# Patient Record
Sex: Female | Born: 1940 | Hispanic: No | Smoking: Never smoker
Health system: Southern US, Community
[De-identification: ages and names within clinical notes are randomized; demographics above are authoritative.]

## PROBLEM LIST (undated history)

## (undated) DIAGNOSIS — H5789 Other specified disorders of eye and adnexa: Secondary | ICD-10-CM

## (undated) DIAGNOSIS — E785 Hyperlipidemia, unspecified: Secondary | ICD-10-CM

## (undated) DIAGNOSIS — R87612 Low grade squamous intraepithelial lesion on cytologic smear of cervix (LGSIL): Secondary | ICD-10-CM

## (undated) DIAGNOSIS — K047 Periapical abscess without sinus: Secondary | ICD-10-CM

## (undated) DIAGNOSIS — R053 Chronic cough: Secondary | ICD-10-CM

## (undated) DIAGNOSIS — W19XXXA Unspecified fall, initial encounter: Secondary | ICD-10-CM

## (undated) DIAGNOSIS — R05 Cough: Secondary | ICD-10-CM

## (undated) DIAGNOSIS — R87619 Unspecified abnormal cytological findings in specimens from cervix uteri: Secondary | ICD-10-CM

## (undated) DIAGNOSIS — K5900 Constipation, unspecified: Secondary | ICD-10-CM

## (undated) DIAGNOSIS — M542 Cervicalgia: Secondary | ICD-10-CM

## (undated) DIAGNOSIS — IMO0002 Reserved for concepts with insufficient information to code with codable children: Secondary | ICD-10-CM

## (undated) DIAGNOSIS — Z227 Latent tuberculosis: Secondary | ICD-10-CM

## (undated) DIAGNOSIS — I10 Essential (primary) hypertension: Secondary | ICD-10-CM

## (undated) HISTORY — DX: Chronic cough: R05.3

## (undated) HISTORY — DX: Unspecified abnormal cytological findings in specimens from cervix uteri: R87.619

## (undated) HISTORY — DX: Other specified disorders of eye and adnexa: H57.89

## (undated) HISTORY — DX: Latent tuberculosis: Z22.7

## (undated) HISTORY — DX: Reserved for concepts with insufficient information to code with codable children: IMO0002

## (undated) HISTORY — DX: Essential (primary) hypertension: I10

## (undated) HISTORY — DX: Cough: R05

## (undated) HISTORY — PX: CATARACT EXTRACTION: SUR2

## (undated) HISTORY — PX: CHOLECYSTECTOMY: SHX55

## (undated) HISTORY — DX: Low grade squamous intraepithelial lesion on cytologic smear of cervix (LGSIL): R87.612

## (undated) HISTORY — DX: Hyperlipidemia, unspecified: E78.5

---
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
## 2003-01-19 ENCOUNTER — Encounter: Payer: Self-pay | Admitting: Surgery

## 2003-01-19 ENCOUNTER — Inpatient Hospital Stay (HOSPITAL_COMMUNITY): Admission: EM | Admit: 2003-01-19 | Discharge: 2003-01-23 | Payer: Self-pay | Admitting: Emergency Medicine

## 2003-01-19 ENCOUNTER — Encounter: Payer: Self-pay | Admitting: Emergency Medicine

## 2003-01-21 ENCOUNTER — Encounter: Payer: Self-pay | Admitting: Internal Medicine

## 2003-01-22 ENCOUNTER — Encounter: Payer: Self-pay | Admitting: Surgery

## 2003-01-22 ENCOUNTER — Encounter (INDEPENDENT_AMBULATORY_CARE_PROVIDER_SITE_OTHER): Payer: Self-pay | Admitting: Specialist

## 2003-03-15 ENCOUNTER — Encounter: Admission: RE | Admit: 2003-03-15 | Discharge: 2003-03-15 | Payer: Self-pay | Admitting: Internal Medicine

## 2003-03-19 ENCOUNTER — Encounter: Admission: RE | Admit: 2003-03-19 | Discharge: 2003-03-19 | Payer: Self-pay | Admitting: Internal Medicine

## 2003-03-19 ENCOUNTER — Encounter: Payer: Self-pay | Admitting: Internal Medicine

## 2003-03-19 ENCOUNTER — Ambulatory Visit (HOSPITAL_COMMUNITY): Admission: RE | Admit: 2003-03-19 | Discharge: 2003-03-19 | Payer: Self-pay | Admitting: Internal Medicine

## 2003-04-05 ENCOUNTER — Encounter: Admission: RE | Admit: 2003-04-05 | Discharge: 2003-04-05 | Payer: Self-pay | Admitting: Internal Medicine

## 2003-04-19 ENCOUNTER — Encounter: Admission: RE | Admit: 2003-04-19 | Discharge: 2003-04-19 | Payer: Self-pay | Admitting: Internal Medicine

## 2004-07-13 ENCOUNTER — Ambulatory Visit (HOSPITAL_COMMUNITY): Admission: RE | Admit: 2004-07-13 | Discharge: 2004-07-13 | Payer: Self-pay | Admitting: Internal Medicine

## 2004-07-13 ENCOUNTER — Ambulatory Visit: Payer: Self-pay | Admitting: Internal Medicine

## 2004-07-14 ENCOUNTER — Ambulatory Visit: Payer: Self-pay | Admitting: Internal Medicine

## 2004-07-22 ENCOUNTER — Ambulatory Visit: Payer: Self-pay | Admitting: Internal Medicine

## 2004-07-29 ENCOUNTER — Ambulatory Visit: Payer: Self-pay | Admitting: Internal Medicine

## 2004-08-12 ENCOUNTER — Ambulatory Visit: Payer: Self-pay | Admitting: Internal Medicine

## 2004-09-18 ENCOUNTER — Ambulatory Visit: Payer: Self-pay | Admitting: Internal Medicine

## 2004-09-23 ENCOUNTER — Ambulatory Visit: Payer: Self-pay | Admitting: Internal Medicine

## 2004-12-08 ENCOUNTER — Encounter (INDEPENDENT_AMBULATORY_CARE_PROVIDER_SITE_OTHER): Payer: Self-pay | Admitting: Internal Medicine

## 2004-12-10 ENCOUNTER — Ambulatory Visit (HOSPITAL_COMMUNITY): Admission: RE | Admit: 2004-12-10 | Discharge: 2004-12-10 | Payer: Self-pay | Admitting: *Deleted

## 2005-01-14 ENCOUNTER — Ambulatory Visit (HOSPITAL_COMMUNITY): Admission: RE | Admit: 2005-01-14 | Discharge: 2005-01-14 | Payer: Self-pay | Admitting: Internal Medicine

## 2005-01-14 ENCOUNTER — Ambulatory Visit: Payer: Self-pay | Admitting: Internal Medicine

## 2005-01-19 ENCOUNTER — Ambulatory Visit: Payer: Self-pay | Admitting: Internal Medicine

## 2005-03-18 ENCOUNTER — Ambulatory Visit: Payer: Self-pay | Admitting: Internal Medicine

## 2005-03-23 ENCOUNTER — Ambulatory Visit (HOSPITAL_COMMUNITY): Admission: RE | Admit: 2005-03-23 | Discharge: 2005-03-23 | Payer: Self-pay | Admitting: Internal Medicine

## 2005-04-23 ENCOUNTER — Ambulatory Visit: Payer: Self-pay | Admitting: Internal Medicine

## 2005-04-30 ENCOUNTER — Ambulatory Visit: Payer: Self-pay | Admitting: Internal Medicine

## 2005-09-23 ENCOUNTER — Ambulatory Visit: Payer: Self-pay | Admitting: Internal Medicine

## 2005-09-29 ENCOUNTER — Ambulatory Visit: Payer: Self-pay | Admitting: Internal Medicine

## 2005-11-15 ENCOUNTER — Encounter (INDEPENDENT_AMBULATORY_CARE_PROVIDER_SITE_OTHER): Payer: Self-pay | Admitting: Specialist

## 2005-11-15 ENCOUNTER — Encounter (INDEPENDENT_AMBULATORY_CARE_PROVIDER_SITE_OTHER): Payer: Self-pay | Admitting: Internal Medicine

## 2005-11-15 ENCOUNTER — Ambulatory Visit: Payer: Self-pay | Admitting: Internal Medicine

## 2005-11-15 DIAGNOSIS — R87612 Low grade squamous intraepithelial lesion on cytologic smear of cervix (LGSIL): Secondary | ICD-10-CM | POA: Insufficient documentation

## 2005-11-22 ENCOUNTER — Ambulatory Visit: Payer: Self-pay | Admitting: Internal Medicine

## 2006-02-02 ENCOUNTER — Other Ambulatory Visit: Admission: RE | Admit: 2006-02-02 | Discharge: 2006-02-02 | Payer: Self-pay | Admitting: Gynecology

## 2006-02-02 ENCOUNTER — Ambulatory Visit: Payer: Self-pay | Admitting: Gynecology

## 2006-02-02 ENCOUNTER — Encounter (INDEPENDENT_AMBULATORY_CARE_PROVIDER_SITE_OTHER): Payer: Self-pay | Admitting: Specialist

## 2006-03-03 ENCOUNTER — Ambulatory Visit: Payer: Self-pay | Admitting: Hospitalist

## 2006-04-20 DIAGNOSIS — E119 Type 2 diabetes mellitus without complications: Secondary | ICD-10-CM | POA: Insufficient documentation

## 2006-04-20 DIAGNOSIS — I10 Essential (primary) hypertension: Secondary | ICD-10-CM | POA: Insufficient documentation

## 2006-05-24 DIAGNOSIS — E785 Hyperlipidemia, unspecified: Secondary | ICD-10-CM | POA: Insufficient documentation

## 2006-09-19 ENCOUNTER — Ambulatory Visit: Payer: Self-pay | Admitting: *Deleted

## 2006-09-19 DIAGNOSIS — K089 Disorder of teeth and supporting structures, unspecified: Secondary | ICD-10-CM | POA: Insufficient documentation

## 2006-09-19 LAB — CONVERTED CEMR LAB
Blood Glucose, Fingerstick: 190
Hgb A1c MFr Bld: 6.4 %

## 2006-10-04 ENCOUNTER — Ambulatory Visit (HOSPITAL_COMMUNITY): Admission: RE | Admit: 2006-10-04 | Discharge: 2006-10-04 | Payer: Self-pay | Admitting: Internal Medicine

## 2007-02-14 ENCOUNTER — Ambulatory Visit: Payer: Self-pay | Admitting: Infectious Disease

## 2007-02-14 LAB — CONVERTED CEMR LAB: Blood Glucose, Fingerstick: 159

## 2007-02-17 ENCOUNTER — Encounter (INDEPENDENT_AMBULATORY_CARE_PROVIDER_SITE_OTHER): Payer: Self-pay | Admitting: *Deleted

## 2007-02-17 ENCOUNTER — Ambulatory Visit: Payer: Self-pay | Admitting: Internal Medicine

## 2007-02-18 LAB — CONVERTED CEMR LAB
ALT: 15 units/L (ref 0–35)
AST: 17 units/L (ref 0–37)
Albumin: 4.2 g/dL (ref 3.5–5.2)
Alkaline Phosphatase: 71 units/L (ref 39–117)
BUN: 20 mg/dL (ref 6–23)
Calcium: 9.2 mg/dL (ref 8.4–10.5)
Chloride: 106 meq/L (ref 96–112)
Cholesterol: 208 mg/dL — ABNORMAL HIGH (ref 0–200)
Creatinine, Ser: 0.79 mg/dL (ref 0.40–1.20)
Glucose, Bld: 114 mg/dL — ABNORMAL HIGH (ref 70–99)
HDL: 46 mg/dL (ref >39)
LDL Cholesterol: 151 mg/dL — ABNORMAL HIGH (ref 0–99)
Potassium: 4.5 meq/L (ref 3.5–5.3)
Triglycerides: 57 mg/dL (ref <150)

## 2007-08-01 ENCOUNTER — Ambulatory Visit: Payer: Self-pay | Admitting: Internal Medicine

## 2007-08-01 ENCOUNTER — Encounter (INDEPENDENT_AMBULATORY_CARE_PROVIDER_SITE_OTHER): Payer: Self-pay | Admitting: Internal Medicine

## 2007-08-01 LAB — CONVERTED CEMR LAB: Hgb A1c MFr Bld: 8.7 %

## 2007-08-02 ENCOUNTER — Encounter (INDEPENDENT_AMBULATORY_CARE_PROVIDER_SITE_OTHER): Payer: Self-pay | Admitting: Internal Medicine

## 2007-08-02 ENCOUNTER — Ambulatory Visit: Payer: Self-pay | Admitting: Infectious Disease

## 2007-08-09 ENCOUNTER — Ambulatory Visit: Payer: Self-pay | Admitting: Hospitalist

## 2007-08-09 LAB — CONVERTED CEMR LAB: Blood Glucose, Home Monitor: 1 mg/dL

## 2007-08-15 DIAGNOSIS — D696 Thrombocytopenia, unspecified: Secondary | ICD-10-CM

## 2007-08-15 LAB — CONVERTED CEMR LAB
ALT: 12 units/L (ref 0–35)
AST: 14 units/L (ref 0–37)
Calcium: 9.7 mg/dL (ref 8.4–10.5)
Cholesterol: 204 mg/dL — ABNORMAL HIGH (ref 0–200)
HCT: 42.2 % (ref 36.0–46.0)
HDL: 40 mg/dL (ref >39)
LDL Cholesterol: 152 mg/dL — ABNORMAL HIGH (ref 0–99)
MCHC: 35.3 g/dL (ref 30.0–36.0)
Microalb Creat Ratio: 5.8 mg/g (ref 0.0–30.0)
Platelets: 131 10*3/uL — ABNORMAL LOW (ref 150–400)
Potassium: 4.8 meq/L (ref 3.5–5.3)
RBC: 4.62 M/uL (ref 3.87–5.11)
Total Bilirubin: 1.3 mg/dL — ABNORMAL HIGH (ref 0.3–1.2)
Total CHOL/HDL Ratio: 5.1
Total Protein: 7.5 g/dL (ref 6.0–8.3)

## 2007-08-16 ENCOUNTER — Encounter (INDEPENDENT_AMBULATORY_CARE_PROVIDER_SITE_OTHER): Payer: Self-pay | Admitting: *Deleted

## 2007-08-16 ENCOUNTER — Ambulatory Visit: Payer: Self-pay | Admitting: Internal Medicine

## 2007-08-16 DIAGNOSIS — D72819 Decreased white blood cell count, unspecified: Secondary | ICD-10-CM | POA: Insufficient documentation

## 2007-08-16 LAB — CONVERTED CEMR LAB
Blood Glucose, Fingerstick: 152
Calcium: 9.8 mg/dL (ref 8.4–10.5)
Chloride: 106 meq/L (ref 96–112)
Creatinine, Ser: 0.74 mg/dL (ref 0.40–1.20)
Potassium: 3.9 meq/L (ref 3.5–5.3)
Sodium: 142 meq/L (ref 135–145)

## 2007-09-14 ENCOUNTER — Ambulatory Visit: Payer: Self-pay | Admitting: *Deleted

## 2007-09-14 LAB — CONVERTED CEMR LAB: Blood Glucose, Fingerstick: 195

## 2007-09-18 ENCOUNTER — Encounter (INDEPENDENT_AMBULATORY_CARE_PROVIDER_SITE_OTHER): Payer: Self-pay | Admitting: Internal Medicine

## 2007-09-18 ENCOUNTER — Ambulatory Visit: Payer: Self-pay | Admitting: Hospitalist

## 2007-09-18 LAB — CONVERTED CEMR LAB
BUN: 14 mg/dL (ref 6–23)
CO2: 23 meq/L (ref 19–32)
Creatinine, Ser: 0.83 mg/dL (ref 0.40–1.20)
Glucose, Bld: 212 mg/dL — ABNORMAL HIGH (ref 70–99)
Potassium: 4.2 meq/L (ref 3.5–5.3)

## 2007-10-17 ENCOUNTER — Ambulatory Visit (HOSPITAL_COMMUNITY): Admission: RE | Admit: 2007-10-17 | Discharge: 2007-10-17 | Payer: Self-pay | Admitting: *Deleted

## 2007-10-27 ENCOUNTER — Encounter: Admission: RE | Admit: 2007-10-27 | Discharge: 2007-10-27 | Payer: Self-pay | Admitting: *Deleted

## 2007-10-30 ENCOUNTER — Ambulatory Visit: Payer: Self-pay | Admitting: Hospitalist

## 2007-10-30 ENCOUNTER — Encounter (INDEPENDENT_AMBULATORY_CARE_PROVIDER_SITE_OTHER): Payer: Self-pay | Admitting: Internal Medicine

## 2007-11-01 ENCOUNTER — Encounter (INDEPENDENT_AMBULATORY_CARE_PROVIDER_SITE_OTHER): Payer: Self-pay | Admitting: *Deleted

## 2007-11-01 ENCOUNTER — Ambulatory Visit (HOSPITAL_COMMUNITY): Admission: RE | Admit: 2007-11-01 | Discharge: 2007-11-01 | Payer: Self-pay | Admitting: Internal Medicine

## 2007-11-06 LAB — CONVERTED CEMR LAB
AST: 17 units/L (ref 0–37)
Alkaline Phosphatase: 69 units/L (ref 39–117)
CO2: 23 meq/L (ref 19–32)
Calcium: 9.4 mg/dL (ref 8.4–10.5)
Glucose, Bld: 84 mg/dL (ref 70–99)
HDL: 39 mg/dL — ABNORMAL LOW (ref >39)
LDL Cholesterol: 110 mg/dL — ABNORMAL HIGH (ref 0–99)
Total Protein: 7.4 g/dL (ref 6.0–8.3)
VLDL: 11 mg/dL (ref 0–40)

## 2007-11-07 ENCOUNTER — Telehealth: Payer: Self-pay | Admitting: *Deleted

## 2007-11-13 DIAGNOSIS — M810 Age-related osteoporosis without current pathological fracture: Secondary | ICD-10-CM

## 2007-12-08 ENCOUNTER — Ambulatory Visit: Payer: Self-pay | Admitting: Internal Medicine

## 2007-12-08 LAB — CONVERTED CEMR LAB: Blood Glucose, Fingerstick: 197

## 2008-02-29 ENCOUNTER — Encounter (INDEPENDENT_AMBULATORY_CARE_PROVIDER_SITE_OTHER): Payer: Self-pay | Admitting: Internal Medicine

## 2008-02-29 ENCOUNTER — Ambulatory Visit: Payer: Self-pay | Admitting: *Deleted

## 2008-02-29 LAB — CONVERTED CEMR LAB: Hgb A1c MFr Bld: 6.3 %

## 2008-03-04 LAB — CONVERTED CEMR LAB
AST: 15 units/L (ref 0–37)
Albumin: 4.3 g/dL (ref 3.5–5.2)
Alkaline Phosphatase: 48 units/L (ref 39–117)
CO2: 25 meq/L (ref 19–32)
Calcium: 9.2 mg/dL (ref 8.4–10.5)
Cholesterol: 187 mg/dL (ref 0–200)
Creatinine, Ser: 0.9 mg/dL (ref 0.40–1.20)
Glucose, Bld: 103 mg/dL — ABNORMAL HIGH (ref 70–99)
HDL: 45 mg/dL (ref >39)
Total CHOL/HDL Ratio: 4.2
Total Protein: 7.5 g/dL (ref 6.0–8.3)

## 2008-03-19 ENCOUNTER — Encounter (INDEPENDENT_AMBULATORY_CARE_PROVIDER_SITE_OTHER): Payer: Self-pay | Admitting: *Deleted

## 2008-03-29 ENCOUNTER — Ambulatory Visit: Payer: Self-pay | Admitting: Internal Medicine

## 2008-04-11 LAB — HM DIABETES EYE EXAM

## 2008-04-16 ENCOUNTER — Ambulatory Visit: Payer: Self-pay | Admitting: Internal Medicine

## 2008-04-16 ENCOUNTER — Observation Stay (HOSPITAL_COMMUNITY): Admission: EM | Admit: 2008-04-16 | Discharge: 2008-04-18 | Payer: Self-pay | Admitting: Emergency Medicine

## 2008-04-16 ENCOUNTER — Ambulatory Visit: Payer: Self-pay | Admitting: *Deleted

## 2008-04-17 ENCOUNTER — Encounter (INDEPENDENT_AMBULATORY_CARE_PROVIDER_SITE_OTHER): Payer: Self-pay | Admitting: Internal Medicine

## 2008-04-30 ENCOUNTER — Encounter (INDEPENDENT_AMBULATORY_CARE_PROVIDER_SITE_OTHER): Payer: Self-pay | Admitting: Internal Medicine

## 2008-05-01 ENCOUNTER — Encounter (INDEPENDENT_AMBULATORY_CARE_PROVIDER_SITE_OTHER): Payer: Self-pay | Admitting: Internal Medicine

## 2008-05-01 ENCOUNTER — Ambulatory Visit: Payer: Self-pay | Admitting: Internal Medicine

## 2008-05-01 LAB — CONVERTED CEMR LAB
Calcium: 9.1 mg/dL (ref 8.4–10.5)
Leukocytes, UA: NEGATIVE
Nitrite: NEGATIVE
Protein, ur: NEGATIVE mg/dL
Specific Gravity, Urine: 1.019 (ref 1.005–1.03)

## 2008-06-07 ENCOUNTER — Ambulatory Visit: Payer: Self-pay | Admitting: Internal Medicine

## 2008-06-07 DIAGNOSIS — R928 Other abnormal and inconclusive findings on diagnostic imaging of breast: Secondary | ICD-10-CM | POA: Insufficient documentation

## 2008-06-07 LAB — CONVERTED CEMR LAB: Blood Glucose, Fingerstick: 193

## 2008-06-18 ENCOUNTER — Encounter: Admission: RE | Admit: 2008-06-18 | Discharge: 2008-06-18 | Payer: Self-pay | Admitting: *Deleted

## 2008-06-21 ENCOUNTER — Encounter (INDEPENDENT_AMBULATORY_CARE_PROVIDER_SITE_OTHER): Payer: Self-pay | Admitting: *Deleted

## 2008-06-21 ENCOUNTER — Ambulatory Visit: Payer: Self-pay | Admitting: Internal Medicine

## 2008-06-21 LAB — CONVERTED CEMR LAB
BUN: 18 mg/dL (ref 6–23)
CO2: 24 meq/L (ref 19–32)
Chloride: 105 meq/L (ref 96–112)
Glucose, Bld: 172 mg/dL — ABNORMAL HIGH (ref 70–99)
Potassium: 4.3 meq/L (ref 3.5–5.3)
Sodium: 142 meq/L (ref 135–145)

## 2008-07-25 ENCOUNTER — Encounter (INDEPENDENT_AMBULATORY_CARE_PROVIDER_SITE_OTHER): Payer: Self-pay | Admitting: Internal Medicine

## 2008-07-25 ENCOUNTER — Ambulatory Visit: Payer: Self-pay | Admitting: Internal Medicine

## 2008-07-25 LAB — CONVERTED CEMR LAB
ALT: 13 units/L (ref 0–35)
AST: 14 units/L (ref 0–37)
BUN: 23 mg/dL (ref 6–23)
Blood Glucose, Fingerstick: 207
CO2: 26 meq/L (ref 19–32)
Calcium: 9.3 mg/dL (ref 8.4–10.5)
Cholesterol: 133 mg/dL (ref 0–200)
Creatinine, Ser: 0.77 mg/dL (ref 0.40–1.20)
Hgb A1c MFr Bld: 6.3 %
LDL Cholesterol: 77 mg/dL (ref 0–99)
Microalb Creat Ratio: 3.5 mg/g (ref 0.0–30.0)
Microalb, Ur: 0.46 mg/dL (ref 0.00–1.89)
Sodium: 143 meq/L (ref 135–145)
Total CHOL/HDL Ratio: 2.7
VLDL: 6 mg/dL (ref 0–40)

## 2008-07-29 ENCOUNTER — Encounter (INDEPENDENT_AMBULATORY_CARE_PROVIDER_SITE_OTHER): Payer: Self-pay | Admitting: *Deleted

## 2008-08-06 ENCOUNTER — Telehealth: Payer: Self-pay | Admitting: *Deleted

## 2008-08-26 ENCOUNTER — Encounter (INDEPENDENT_AMBULATORY_CARE_PROVIDER_SITE_OTHER): Payer: Self-pay | Admitting: *Deleted

## 2008-09-03 ENCOUNTER — Encounter (INDEPENDENT_AMBULATORY_CARE_PROVIDER_SITE_OTHER): Payer: Self-pay | Admitting: *Deleted

## 2008-09-13 ENCOUNTER — Ambulatory Visit: Payer: Self-pay | Admitting: Obstetrics & Gynecology

## 2008-09-13 ENCOUNTER — Encounter: Payer: Self-pay | Admitting: Obstetrics & Gynecology

## 2008-11-05 ENCOUNTER — Telehealth (INDEPENDENT_AMBULATORY_CARE_PROVIDER_SITE_OTHER): Payer: Self-pay | Admitting: *Deleted

## 2008-12-03 ENCOUNTER — Ambulatory Visit: Payer: Self-pay | Admitting: Internal Medicine

## 2008-12-03 ENCOUNTER — Encounter (INDEPENDENT_AMBULATORY_CARE_PROVIDER_SITE_OTHER): Payer: Self-pay | Admitting: Internal Medicine

## 2008-12-03 LAB — CONVERTED CEMR LAB
ALT: 16 units/L (ref 0–35)
AST: 19 units/L (ref 0–37)
Albumin: 4.2 g/dL (ref 3.5–5.2)
Alkaline Phosphatase: 44 units/L (ref 39–117)
Blood Glucose, AC Bkfst: 113 mg/dL
Calcium: 9.2 mg/dL (ref 8.4–10.5)
Glucose, Bld: 115 mg/dL — ABNORMAL HIGH (ref 70–99)
Potassium: 4.6 meq/L (ref 3.5–5.3)
Sodium: 143 meq/L (ref 135–145)
Total Bilirubin: 0.9 mg/dL (ref 0.3–1.2)

## 2009-04-17 ENCOUNTER — Ambulatory Visit (HOSPITAL_COMMUNITY): Admission: RE | Admit: 2009-04-17 | Discharge: 2009-04-17 | Payer: Self-pay | Admitting: Family Medicine

## 2009-07-08 ENCOUNTER — Encounter: Payer: Self-pay | Admitting: Internal Medicine

## 2009-07-08 ENCOUNTER — Ambulatory Visit (HOSPITAL_COMMUNITY): Admission: RE | Admit: 2009-07-08 | Discharge: 2009-07-08 | Payer: Self-pay | Admitting: Gastroenterology

## 2009-07-08 LAB — HM COLONOSCOPY
HM Colonoscopy: NORMAL
HM Colonoscopy: NORMAL

## 2009-12-08 ENCOUNTER — Telehealth (INDEPENDENT_AMBULATORY_CARE_PROVIDER_SITE_OTHER): Payer: Self-pay | Admitting: Internal Medicine

## 2009-12-09 ENCOUNTER — Telehealth: Payer: Self-pay | Admitting: *Deleted

## 2010-01-09 ENCOUNTER — Ambulatory Visit: Payer: Self-pay | Admitting: Internal Medicine

## 2010-01-19 ENCOUNTER — Ambulatory Visit: Payer: Self-pay | Admitting: Internal Medicine

## 2010-01-20 LAB — CONVERTED CEMR LAB
Alkaline Phosphatase: 53 units/L (ref 39–117)
BUN: 14 mg/dL (ref 6–23)
Cholesterol: 170 mg/dL (ref 0–200)
Creatinine, Urine: 203.5 mg/dL
Glucose, Bld: 118 mg/dL — ABNORMAL HIGH (ref 70–99)
HDL: 38 mg/dL — ABNORMAL LOW (ref >39)
Microalb, Ur: 0.68 mg/dL (ref 0.00–1.89)
Platelets: 122 10*3/uL — ABNORMAL LOW (ref 150–400)
RBC: 4.21 M/uL (ref 3.87–5.11)
Sodium: 142 meq/L (ref 135–145)
Total Bilirubin: 1.2 mg/dL (ref 0.3–1.2)
Total CHOL/HDL Ratio: 4.5
WBC: 3.7 10*3/uL — ABNORMAL LOW (ref 4.0–10.5)

## 2010-01-28 ENCOUNTER — Ambulatory Visit: Payer: Self-pay | Admitting: Internal Medicine

## 2010-01-28 LAB — CONVERTED CEMR LAB
OCCULT 1: NEGATIVE
OCCULT 2: NEGATIVE

## 2010-02-02 ENCOUNTER — Ambulatory Visit: Payer: Self-pay | Admitting: Obstetrics & Gynecology

## 2010-02-02 LAB — CONVERTED CEMR LAB: Pap Smear: NEGATIVE

## 2010-02-05 LAB — HM PAP SMEAR: HM Pap smear: NEGATIVE

## 2010-02-09 ENCOUNTER — Ambulatory Visit: Payer: Self-pay | Admitting: Internal Medicine

## 2010-02-09 ENCOUNTER — Other Ambulatory Visit: Payer: Self-pay | Admitting: Dietician

## 2010-02-09 LAB — CONVERTED CEMR LAB: Blood Glucose, Fingerstick: 170

## 2010-02-10 ENCOUNTER — Encounter: Payer: Self-pay | Admitting: Internal Medicine

## 2010-02-10 LAB — CONVERTED CEMR LAB
CO2: 23 meq/L (ref 19–32)
Calcium: 9.3 mg/dL (ref 8.4–10.5)
Creatinine, Ser: 0.82 mg/dL (ref 0.40–1.20)
Glucose, Bld: 161 mg/dL — ABNORMAL HIGH (ref 70–99)
Potassium: 4.2 meq/L (ref 3.5–5.3)
Sodium: 142 meq/L (ref 135–145)

## 2010-02-20 ENCOUNTER — Ambulatory Visit: Payer: Self-pay | Admitting: Obstetrics & Gynecology

## 2010-03-30 ENCOUNTER — Ambulatory Visit: Payer: Self-pay | Admitting: Internal Medicine

## 2010-03-30 DIAGNOSIS — H269 Unspecified cataract: Secondary | ICD-10-CM

## 2010-03-30 LAB — CONVERTED CEMR LAB
BUN: 17 mg/dL (ref 6–23)
CO2: 25 meq/L (ref 19–32)
Calcium: 9.7 mg/dL (ref 8.4–10.5)
Chloride: 104 meq/L (ref 96–112)
Creatinine, Ser: 0.93 mg/dL (ref 0.40–1.20)
Glucose, Bld: 110 mg/dL — ABNORMAL HIGH (ref 70–99)
Hgb A1c MFr Bld: 6.6 %
Potassium: 4.4 meq/L (ref 3.5–5.3)
Sodium: 141 meq/L (ref 135–145)

## 2010-04-06 ENCOUNTER — Ambulatory Visit: Payer: Self-pay | Admitting: Internal Medicine

## 2010-04-06 DIAGNOSIS — K921 Melena: Secondary | ICD-10-CM

## 2010-04-06 LAB — CONVERTED CEMR LAB: OCCULT 1: NEGATIVE

## 2010-04-09 ENCOUNTER — Encounter: Payer: Self-pay | Admitting: *Deleted

## 2010-04-09 ENCOUNTER — Telehealth: Payer: Self-pay | Admitting: *Deleted

## 2010-04-11 LAB — FECAL OCCULT BLOOD, GUAIAC: Fecal Occult Blood: NEGATIVE

## 2010-04-20 ENCOUNTER — Ambulatory Visit: Payer: Self-pay | Admitting: Internal Medicine

## 2010-04-20 LAB — CONVERTED CEMR LAB: OCCULT 2: NEGATIVE

## 2010-05-04 ENCOUNTER — Telehealth: Payer: Self-pay | Admitting: *Deleted

## 2010-05-19 ENCOUNTER — Ambulatory Visit (HOSPITAL_COMMUNITY): Admission: RE | Admit: 2010-05-19 | Discharge: 2010-05-19 | Payer: Self-pay | Admitting: Specialist

## 2010-05-19 ENCOUNTER — Encounter: Payer: Self-pay | Admitting: Internal Medicine

## 2010-05-19 LAB — HM MAMMOGRAPHY: HM Mammogram: NEGATIVE

## 2010-07-25 ENCOUNTER — Encounter: Payer: Self-pay | Admitting: *Deleted

## 2010-07-26 ENCOUNTER — Encounter: Payer: Self-pay | Admitting: *Deleted

## 2010-07-27 ENCOUNTER — Encounter: Payer: Self-pay | Admitting: *Deleted

## 2010-08-04 ENCOUNTER — Telehealth: Payer: Self-pay | Admitting: *Deleted

## 2010-08-04 NOTE — Consult Note (Signed)
Summary: COLONOSCOPY  COLONOSCOPY   Imported By: Margie Billet 04/09/2010 14:24:32  _____________________________________________________________________  External Attachment:    Type:   Image     Comment:   External Document

## 2010-08-04 NOTE — Progress Notes (Signed)
Summary: stool cards//kg  ---- Converted from flag ---- ---- 04/08/2010 1:46 PM, Lars Mage MD wrote: We can mail 3 more cards and have her mail them back before we decide on repeat COLONOSCOPY.  Thanks Ankit  ---- 04/07/2010 3:20 PM, Cynda Familia (AAMA) wrote: Dr Eben Burow,  This pt had a colonoscopy in jan 2011 with repeat in 10 years, does she still need a gi visit, please advise.[name] ------------------------------  Phone Note Outgoing Call Call back at [facility] Phone [phone]   Call placed by: Cynda Familia [facility]),  April 09, 2010 11:20 AM Call placed to: Patient Summary of Call: Call placed to pt- sounded like someone picked up the phone and then hung it up.  Because of the language barrier,  I will mail a letter and stool cards to pt's address and have them contact me with questions.   Initial call taken by: Cynda Familia [facility]),  April 09, 2010 11:21 AM

## 2010-08-04 NOTE — Progress Notes (Signed)
----   Converted from flag ---- ---- 12/08/2009 4:27 PM, Shon Hough wrote: Rivka Barbara, I tried to contact this patient.  She said she did not understand english.  Then I called the number for her son and it was disconnected.   Maybe she will tell her son to call us.    ---- 12/08/2009 12:01 PM, Chinita Pester RN wrote: Juanell Fairly, can u schedule Ms. Montone an appt per Dr. Andrey Campanile for further refills. Thanks ------------------------------

## 2010-08-04 NOTE — Assessment & Plan Note (Signed)
Summary: EST-1 MONTH ROUTINE CHECKUP/CH   Vital Signs:  Patient profile:   70 year old female Height:      60.25 inches (153.04 cm) Weight:      119.8 pounds (54.45 kg) BMI:     23.29 Temp:     98.2 degrees F (36.78 degrees C) oral Pulse rate:   82 / minute BP sitting:   130 / 80  (left arm) Cuff size:   regular  Vitals Entered By: Cynda Familia Duncan Dull) (March 30, 2010 2:04 PM) CC: routine f/u Is Patient Diabetic? Yes Did you bring your meter with you today? No Pain Assessment Patient in pain? no      Nutritional Status BMI of 19 -24 = normal CBG Result 87  Does patient need assistance? Functional Status Self care Ambulation Normal   Primary Care Provider:  Lars Mage MD  CC:  routine f/u.  History of Present Illness: Patient is 70 year old women with PMH as described in EMR is here today for a follow up for her BP.  Patient does not speak english and her daughterwho is the caregiver is acting as a Nurse, learning disability for her.  Her BP is well controlled today.  Her DM is well controlled with last Hba1c 6.4 on 01/2010. She cannot afford to pay an opthalmologist and wants me to refer her to an ophthalmologist for cataract surgery.  She denies any new sicknesses or hospitalizations, no chest pain episodes, no fevers, no chills, no abdominal or urinary concerns. No recent changes in appetite, weight.   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 Take 1 Tablet By Mouth Once A Day 2)  Calcium 500/d 500-200 Mg-Unit Tabs (Calcium Carbonate-Vitamin D) .... Take 1 Tablet By Mouth Three Times A Day 3)  Metformin Hcl 500 Mg Tabs (Metformin Hcl) .... Start With 1/2 Tab A Day and Then After 1 Week Take 1/2 Tab Twice Daily For Next 2 Weeks and Then Take 1000mg  Tab Twice Daily 4)  Losartan Potassium 25 Mg Tabs (Losartan Potassium) .... Once Daily  Current Medications (verified): 1)  Zocor 40 Mg Tabs (Simvastatin) .... Take 1 Tablet By Mouth Once A Day 2)  Calcium 500/d 500-200 Mg-Unit Tabs (Calcium Carbonate-Vitamin D) .... Take 1 Tablet By Mouth Three Times A Day 3)  Metformin Hcl 500 Mg Tabs (Metformin Hcl) .... Start With 1/2 Tab A Day and Then After 1 Week Take 1/2 Tab Twice Daily For Next 2 Weeks and Then Take 1000mg  Tab Twice Daily 4)  Losartan Potassium 25 Mg Tabs (Losartan Potassium) .... 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 PAP in 05/07 showed LGSIL Nornal screening colonoscopy 2011 Chronic red eyes    - referred to ophtalmology Osteoporosis (Dexa 4/09)    - starting alendronate 6/09  Past Surgical History: Last updated: 04/20/2006 Cholecystectomy  Social History: Last updated: 02/29/2008 Moved here from [place] in  ~2004. Widowed. No smoking, etoh, or illegal drugs. Lives wit her  daughter.  Risk Factors: Alcohol Use: 0 (03/30/2010) Exercise: yes (01/09/2010)  Risk Factors: Smoking Status: never (03/30/2010)  Social History: Reviewed history from 02/29/2008 and no changes required. Moved here from [place] in  ~2004. Widowed. No smoking, etoh, or illegal drugs. Lives wit her daughter.  Review of Systems      See HPI  Physical Exam  Additional Exam:  Gen: AOx3, in no acute distress Eyes: PERRL, EOMI, b/l cataracts detected with positive shodow over the lens. ENT:MMM, No erythema noted in posterior pharynx Neck: No JVD, No LAP Chest: CTAB with  good respiratory effort CVS: regular rhythmic rate, NO M/R/G, S1 S2 normal Abdo: soft,ND, BS+x4, Non tender and No hepatosplenomegaly EXT: No odema noted Neuro: Non focal, gait is normal Skin: no rashes noted.    Impression & Recommendations:  Problem # 1:  ABFND PAP SMEAR LGSIL (ICD-795.03) Assessment Unchanged Instruected by GYN to follow up with another pap in 6 months.  Problem # 2:  DIABETES MELLITUS, TYPE II (ICD-250.00) At goal.  Her updated medication list for this problem includes:    Metformin Hcl 500 Mg Tabs (Metformin hcl) ..... Start with 1/2 tab a day and then after 1 week take 1/2 tab twice daily for next 2 weeks and then take 1000mg  tab twice daily    Losartan Potassium 25 Mg Tabs (Losartan potassium) ..... Once daily  Orders: T- Capillary Blood Glucose (16010)  Problem # 3:  HYPERTENSION (ICD-401.9) Assessment: Improved At goal Will check Bmet today for Crt and K.  Her updated medication list for this problem includes:    Losartan Potassium 25 Mg Tabs (Losartan potassium) ..... Once daily  Orders: T-Basic Metabolic Panel [phone])  BP today: 130/80 Prior BP: 134/71 (02/09/2010)  Labs Reviewed: K+: 4.2 (02/10/2010) Creat: : 0.82 (02/10/2010)   Chol: 170 (01/19/2010)   HDL: 38 (01/19/2010)   LDL: 118 (01/19/2010)   TG: 69 (01/19/2010)  Problem # 4:  CATARACT, NUCLEAR,  BILATERAL (ICD-366.04) Assessment: New Patient was told to pay $3000 for the surgery which she does not have. Talked to [facility] but they do not have free clinics. Talked to [facility] and they are full at this time but told us to call back in 2 weeks if any spots open up.  Problem # 5:  Preventive Health Care (ICD-V70.0) Assessment: Comment Only Hemmoccults and flu vaccine today.  Complete Medication List: 1)  Zocor 40 Mg Tabs (Simvastatin) .... Take 1 tablet by mouth once a day 2)  Calcium 500/d 500-200 Mg-unit Tabs (Calcium carbonate-vitamin d) .... Take 1 tablet by mouth three times a day 3)  Metformin Hcl 500 Mg Tabs (Metformin hcl) .... Start with 1/2 tab a day and then after 1 week take 1/2 tab twice daily for next 2 weeks and then take 1000mg  tab twice daily 4)  Losartan Potassium 25 Mg Tabs (Losartan  potassium) .... Once daily  Other Orders: T-Hgb A1C (in-house) ([id number]) T-Hemoccult Card-Multiple (take home) (95638) Admin 1st Vaccine (75643) Flu Vaccine 83yrs + (32951)  Patient Instructions: 1)  Please schedule a follow-up appointment as needed. 2)  It is important that you exercise regularly at least 20 minutes 5 times a week. If you develop chest pain, have severe difficulty breathing, or feel very tired , stop exercising immediately and seek medical attention. 3)  Schedule your mammogram. Process Orders Check Orders Results:     [facility]: ABN not required for this insurance Tests Sent for requisitioning (March 31, 2010 11:00 AM):     03/30/2010: [facility] -- T-Basic Metabolic Panel [phone] (signed)     Prevention & Chronic Care Immunizations   Influenza vaccine: Fluvax 3+  (03/30/2010)   Influenza vaccine deferral: Deferred  (02/09/2010)    Tetanus booster: 02/09/2010: Tdap    Pneumococcal vaccine: Pneumovax  (02/09/2010)   Pneumococcal vaccine deferral: Deferred  (01/09/2010)    H. zoster vaccine: Not documented   H.  zoster vaccine deferral: Deferred  (01/09/2010)  Colorectal Screening   Hemoccult: Not documented   Hemoccult action/deferral: Ordered  (03/30/2010)    Colonoscopy: Essentially normal initial screening colonoscopy.  Minimal right-sided diverticulosis. Dr Matthias Hughs  (07/09/2007)   Colonoscopy action/deferral: GI referral  (03/30/2010)  Other Screening   Pap smear: NEGATIVE FOR INTRAEPITHELIAL LESIONS OR MALIGNANCY. BENIGN REACTIVE/REPARATIVE CHANGES.  (02/02/2010)   Pap smear action/deferral: GYN Referral  (01/19/2010)    Mammogram: ASSESSMENT: Negative - BI-RADS 1^MM DIGITAL SCREENING  (04/17/2009)   Mammogram action/deferral: Ordered  (01/19/2010)    DXA bone density scan: Lumbar Spine:  T Score< -2.5 Spine.   Hip Total: T Score -2.5 to -1.0 Hip.      (11/13/2007)   DXA bone density action/deferral: Deferred  (02/09/2010)   Smoking status: never  (03/30/2010)  Diabetes Mellitus   HgbA1C: 6.6  (03/30/2010)    Eye exam: No diabetic retinopathy.   Cataract.     (04/11/2008)   Diabetic eye exam action/deferral: Ophthalmology referral  (01/19/2010)   Eye exam due: 04/2009    Foot exam: yes  (01/19/2010)   Foot exam action/deferral: Do today   High risk foot: No  (01/19/2010)   Foot care education: Done  (01/19/2010)    Urine microalbumin/creatinine ratio: 3.3  (01/19/2010)   Urine microalbumin action/deferral: Ordered    Diabetes flowsheet reviewed?: Yes   Progress toward A1C goal: At goal  Lipids   Total Cholesterol: 170  (01/19/2010)   LDL: 118  (01/19/2010)   LDL Direct: Not documented   HDL: 38  (01/19/2010)   Triglycerides: 69  (01/19/2010)    SGOT (AST): 16  (01/19/2010)   BMP action: Ordered   SGPT (ALT): 13  (01/19/2010)   Alkaline phosphatase: 53  (01/19/2010)   Total bilirubin: 1.2  (01/19/2010)    Lipid flowsheet reviewed?: Yes   Progress toward LDL goal: At goal  Hypertension   Last Blood Pressure: 130 / 80  (03/30/2010)   Serum creatinine: 0.82   (02/10/2010)   BMP action: Ordered   Serum potassium 4.2  (02/10/2010)    Hypertension flowsheet reviewed?: Yes   Progress toward BP goal: At goal  Self-Management Support :   Personal Goals (by the next clinic visit) :     Personal A1C goal: 7  (01/09/2010)     Personal blood pressure goal: 130/80  (01/09/2010)     Personal LDL goal: 100  (01/09/2010)    Patient  will work on the following items until the next clinic visit to reach self-care goals:     Medications and monitoring: take my medicines every day  (03/30/2010)     Eating: eat foods that are low in salt, eat baked foods instead of fried foods  (03/30/2010)     Activity: take a 30 minute walk every day  (01/19/2010)    Diabetes self-management support: Resources for patients handout, Written self-care plan  (03/30/2010)   Diabetes care plan printed   Last diabetes self-management training by diabetes educator: 08/09/2007   Last medical nutrition therapy: 09/14/2007    Hypertension self-management support: Resources for patients handout, Written self-care plan  (03/30/2010)   Hypertension self-care plan printed.    Lipid self-management support: Resources for patients handout, Written self-care plan  (03/30/2010)   Lipid self-care plan printed.      Resource handout printed.   Nursing Instructions: Give Flu vaccine today GI referral for screening colonoscopy (see order) Provide Hemoccult cards with instructions (see order)   Laboratory Results   Blood Tests   Date/Time Received: March 30, 2010 2:22 PM  Date/Time Reported: Burke Keels  March 30, 2010 2:22 PM   HGBA1C: 6.6%   (Normal Range: Non-Diabetic - 3-6%   Control Diabetic - 6-8%) CBG Random:: 87mg /dL      Colonoscopy  Procedure date:  07/09/2007  Findings:      Essentially normal initial screening colonoscopy.  Minimal right-sided diverticulosis. Dr Matthias Hughs.  Colonoscopy  Procedure date:  07/09/2007  Findings:       Essentially normal initial screening colonoscopy.  Minimal right-sided diverticulosis. Dr Matthias Hughs  Comments:      Repeat colonoscopy in 10 years.      Process Orders Check Orders Results:     [facility]: ABN not required for this insurance Tests Sent for requisitioning (March 31, 2010 11:00 AM):     03/30/2010: [facility] -- T-Basic Metabolic Panel [phone] (signed)   Flu Vaccine Consent Questions     Do you have a history of severe allergic reactions to this vaccine? no    Any prior history of allergic reactions to egg and/or gelatin? no    Do you have a sensitivity to the preservative Thimersol? no    Do you have a past history of Guillan-Barre Syndrome? no    Do you currently have an acute febrile illness? no    Have you ever had a severe reaction to latex? no    Vaccine information given and explained to patient? yes    Are you currently pregnant? no    Lot Number:[id number]   Exp Date:01/02/2011   Manufacturer: [employer]    Site Given  Left Deltoid IM.Cynda Familia [facility])  March 30, 2010 2:49 PM      03/30/2010: [facility] -- T-Basic Metabolic Panel [phone] (signed)    .opcflu

## 2010-08-04 NOTE — Progress Notes (Signed)
Summary: med refill/gp  Phone Note Refill Request Message from:  Fax from Pharmacy on December 08, 2009 10:50 AM  Refills Requested: Medication #1:  GLIPIZIDE 2.5 MG XR24H-TAB Take 1 tablet by mouth once a day   Last Refilled: 10/04/2009 last appt. June 2010.   Method Requested: Electronic Initial call taken by: Chinita Pester RN,  December 08, 2009 10:50 AM  Follow-up for Phone Call        I will refill x 1 but please let them know she is due for an appt.  Follow-up by: Joaquin Courts  MD,  December 08, 2009 11:52 AM  Additional Follow-up for Phone Call Additional follow up Details #1::        No answer when pt.was called; I sent Chilon a flag to schedule an appt. Additional Follow-up by: Chinita Pester RN,  December 08, 2009 12:01 PM    Prescriptions: GLIPIZIDE 2.5 MG XR24H-TAB (GLIPIZIDE) Take 1 tablet by mouth once a day  #30 x 0   Entered and Authorized by:   Joaquin Courts  MD   Signed by:   Joaquin Courts  MD on 12/08/2009   Method used:   Electronically to        [facility] [address] (retail)       [phone]       [place], [place]  [address]       Ph: [phone]       Fax: [phone]   RxID:   [id number]

## 2010-08-04 NOTE — Assessment & Plan Note (Signed)
Summary: EST-LABS AND RECHECK/CH   Vital Signs:  Patient profile:   70 year old female Height:      60.25 inches (153.04 cm) Weight:      121.5 pounds (55.23 kg) BMI:     23.62 Temp:     98.1 degrees F (36.72 degrees C) oral Pulse rate:   66 / minute BP sitting:   158 / 80  (right arm)  Vitals Entered By: Cynda Familia Duncan Dull) (January 19, 2010 9:12 AM) CC: pt here to f/u bp-restarted lisinopril on 7/15, no meds this am Is Patient Diabetic? Yes Did you bring your meter with you today? No Pain Assessment Patient in pain? no      Nutritional Status BMI of 25 - 29 = overweight  Have you ever been in a relationship where you felt threatened, hurt or afraid?No   Does patient need assistance? Functional Status Self care Ambulation Normal Comments stooll cards given to pt.Cynda Familia [facility])  January 19, 2010 9:53 AM    Diabetic Foot Exam Last Podiatry Exam Date: 07/25/2008 Foot Inspection Is there a history of a foot ulcer?              No Is there a foot ulcer now?              No Can the patient see the bottom of their feet?          Yes Are the shoes appropriate in style and fit?          Yes Is there swelling or an abnormal foot shape?          No Are the toenails long?                No Are the toenails thick?                Yes Are the toenails ingrown?              Yes Is there heavy callous build-up?              No Is there pain in the calf muscle (Intermittent claudication) when walking?    No Diabetic Foot Care Education Patient educated on appropriate care of diabetic feet.  Pulse Check          Right Foot          Left Foot Posterior Tibial:        diminished            diminished Dorsalis Pedis:        diminished            diminished  High Risk Feet? No   10-g (5.07) Semmes-Weinstein Monofilament Test Performed by: Connie Powell          Right Foot          Left Foot Test Control      normal         normal Site 1         normal         normal Site 2          normal         normal Site 3         normal         normal Site 4         normal         normal Site 5  normal         normal Site 6         normal         normal  Impression      normal         normal   Primary Care Provider:  Lars Mage MD  CC:  pt here to f/u bp-restarted lisinopril on 7/15 and no meds this am.  History of Present Illness: Connie Powell is a 70 year old women with PMH as described in EMR is here today for a follow up.  Her DM is well controlled with last Hba1c 6.4, I will get an Hba1c when she comes for lab draw. Opthal exam pending to be done in next visit.  patient was called in for a BP recheck and bmet after starting her lisinopril but patinet just got the emds yesterday and has taken 3 doses of it as of today. i will check her other regular blood tests todaya dn have her back in 2 weeks for BP check and repat bmet. BP is little uncontrolled today but she has not started her meds.  Hyperlipidemia, I refilled her meds and will check FLP with next blood draw.  No other complaints.  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  Take 1 Tablet By Mouth Once A Day 2)  Calcium 500/d 500-200 Mg-Unit Tabs (Calcium Carbonate-Vitamin D) .... Take 1 Tablet By Mouth Three Times A Day 3)  Lisinopril 10 Mg Tabs (Lisinopril) .... Take 1 Tablet By Mouth Once A Day 4)  Metformin Hcl 500 Mg Tabs (Metformin Hcl) .... Start With 1/2 Tab A Day and Then After 1 Week Take 1/2 Tab Twice Daily For Next 2 Weeks and Then Take 1000mg  Tab Twice Daily  Current Medications (verified): 1)  Zocor 40 Mg Tabs (Simvastatin) .... Take 1 Tablet By Mouth Once A Day 2)  Calcium 500/d 500-200 Mg-Unit Tabs (Calcium Carbonate-Vitamin D) .... Take 1 Tablet By Mouth Three Times A Day 3)  Lisinopril 10 Mg Tabs (Lisinopril) .... Take 1 Tablet By Mouth Once A Day 4)  Metformin Hcl 500 Mg Tabs (Metformin Hcl) .... Start With 1/2 Tab A Day and Then After 1 Week Take 1/2 Tab Twice Daily For Next 2 Weeks and Then Take 1000mg  Tab Twice Daily  Allergies (verified): No Known Drug Allergies  Past History:  Past Surgical History: Last updated: 04/20/2006 Cholecystectomy  Social History: Last updated: 02/29/2008 Moved here from [place] in  ~2004. Widowed. No smoking, etoh, or illegal drugs. Lives wit her daughter.  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 PAP in 05/07 showed LGSIL Nornal screening colonoscopy 2011 Chronic red eyes    - referred to ophtalmology Osteoporosis (Dexa 4/09)    - starting alendronate 6/09  Review of Systems      See HPI  Physical Exam  Additional Exam:  Gen: AOx3, in no acute distress Eyes: PERRL, EOMI ENT:MMM, No erythema noted in posterior pharynx Neck: No JVD, No  LAP Chest: CTAB with  good respiratory effort CVS: regular rhythmic rate, NO M/R/G, S1 S2 normal Abdo: soft,ND, BS+x4, Non tender and No hepatosplenomegaly EXT: No odema noted Neuro: Non focal, gait is normal Skin: no rashes noted.   Diabetes Management Exam:    Foot Exam (with socks and/or shoes not present):       Sensory-Monofilament:          Left foot: normal          Right foot: normal   Impression & Recommendations:  Problem # 1:  SPECIAL SCREENING FOR MALIGNANT NEOPLASMS COLON (ICD-V76.51) Assessment Comment Only  Given stool cards today.   Future Orders: T-Hemoccult Cards-Multiple ([id number]) ... 02/09/2010  Future Orders: T-Hemoccult Cards-Multiple ([id number]) ... 02/09/2010  Problem # 2:  SCREENING FOR MALIGNANT NEOPLASM OF THE CERVIX (ICD-V76.2) Assessment: Comment Only Given today. Orders: Gynecologic Referral (Gyn)  Problem # 3:  THROMBOCYTOPENIA (ICD-287.5) Assessment: Comment Only CBC ordered today for reveiw of her platelets.  Problem # 4:  HYPERLIPIDEMIA (ICD-272.4) Assessment: Comment Only Ordered lipid panel today.  Her updated medication list for this problem includes:    Zocor 40 Mg Tabs (Simvastatin) .[name]... Take 1 tablet by mouth once a day  Orders: T-Lipid Profile [id number])  Labs Reviewed: SGOT: 19 (12/03/2008)   SGPT: 16 (12/03/2008)   HDL:50 (07/25/2008), 45 (02/29/2008)  LDL:77 (07/25/2008), 127 (62/95/2841)  Chol:133 (07/25/2008), 187 (02/29/2008)  Trig:31 (07/25/2008), 75 (02/29/2008)  Problem # 5:  HEALTH SCREENING (ICD-V70.0) Assessment: Comment Only mamogram ordered.  Problem # 6:  DIABETES MELLITUS, TYPE II (ICD-250.00) Optha referral given today. as last eye exam was 2009. Urine microalbumin today though we have already started lisinopril on her. Hba1c 6.6 BP is high but she is coming back for recheck in 2 weeks once she is on lisinopril for 2 weeks. Diabetic clinic referral given today.  Her updated medication list for this  problem includes:    Lisinopril 10 Mg Tabs (Lisinopril) .[name]... Take 1 tablet by mouth once a day    Metformin Hcl 500 Mg Tabs (Metformin hcl) ..... Start with 1/2 tab a day and then after 1 week take 1/2 tab twice daily for next 2 weeks and then take 1000mg  tab twice daily  Orders: T-Urine Microalbumin w/creat. ratio ([phone]) Diabetic Clinic Referral (Diabetic) Ophthalmology Referral (Ophthalmology)  Labs Reviewed: Creat: 0.86 (12/03/2008)     Last Eye Exam: No diabetic retinopathy.   Cataract.    (04/11/2008) Reviewed HgBA1c results: 6.6 (01/09/2010)  6.4 (12/03/2008)  Problem # 7:  HYPERTENSION (ICD-401.9) Assessment: Unchanged  patient has not started lisinopril as prescribned and I will call her abck in 2 weeks for a recheck.  Orders: T-CMP with Estimated GFR ([id number])  BP today: 158/80 Prior BP: 155/77 (01/09/2010)  Labs Reviewed: K+: 4.6 (12/03/2008) Creat: : 0.86 (12/03/2008)  Chol: 133 (07/25/2008)   HDL: 50 (07/25/2008)   LDL: 77 (07/25/2008)   TG: 31 (07/25/2008)  Her updated medication list for this problem includes:    Lisinopril 10 Mg Tabs (Lisinopril) .[name]... Take 1 tablet by mouth once a day  Problem # 8:  LEUKOPENIA, MILD (ICD-288.50) Assessment: Comment Only SEE #thrombocytopenia.  Complete Medication List: 1)  Zocor 40 Mg Tabs (Simvastatin) .... Take 1 tablet by mouth once a day 2)  Calcium 500/d 500-200 Mg-unit Tabs (Calcium carbonate-vitamin d) .... Take 1 tablet by mouth three times a day 3)  Lisinopril 10 Mg Tabs (Lisinopril) .... Take 1 tablet by mouth once a day 4)  Metformin Hcl 500 Mg Tabs (Metformin hcl) .... Start with 1/2 tab a day and then after 1 week take 1/2 tab twice daily for next 2 weeks and then take 1000mg  tab twice daily  Other Orders: T-CBC No Diff ([id number])  Patient Instructions: 1)  Please schedule a follow-up appointment in 2 weeks. 2)  Limit your Sodium (Salt). 3)  It is important that you exercise  regularly at least 20 minutes 5 times a week. If you develop chest pain, have severe difficulty breathing, or feel very tired , stop exercising immediately and seek medical attention. 4)  Schedule your mammogram. 5)  Complete your Hemocult cards and return them soon. 6)  You need to have a Pap Smear to prevent cervical cancer. 7)  Check your blood sugars regularly. If your readings are usually above :200 or below 70 you should contact our office. 8)  It is important that your Diabetic A1c level is checked every 3 months. 9)  See your eye doctor yearly to check for diabetic eye damage. 10)  Check your feet each night for sore areas, calluses or signs of infection. 11)  Check your Blood Pressure regularly. If it is above: 160/100 you should make an appointment. Process Orders Check Orders Results:     [facility]: ABN not required for this insurance Tests Sent for requisitioning (January 19, 2010 3:39 PM):     01/19/2010: [facility] -- T-CMP with Estimated GFR [[id number]] (signed)     01/19/2010: [facility] -- T-Lipid Profile [phone] (signed)     01/19/2010: [facility] -- T-CBC No Diff [[id number]] (signed)     01/19/2010: [facility] -- T-Urine Microalbumin w/creat. ratio [[id number]] (signed)     02/09/2010: [facility] -- T-Hemoccult Cards-Multiple [phone] (signed)    Prevention & Chronic Care Immunizations   Influenza vaccine: Not documented   Influenza vaccine deferral: Deferred  (01/09/2010)    Tetanus booster: Not documented    Pneumococcal vaccine: Not documented   Pneumococcal vaccine deferral: Deferred  (01/09/2010)    H. zoster vaccine: Not documented   H. zoster vaccine deferral: Deferred  (01/09/2010)  Colorectal Screening   Hemoccult: Not documented   Hemoccult action/deferral: Deferred  (01/19/2010)    Colonoscopy: Not documented   Colonoscopy  action/deferral: GI referral  (01/19/2010)  Other Screening   Pap smear: LOW GRADE SQUAMOUS INTRAEPITHELIAL LESION: CIN-1/ VAIN-1/  (09/13/2008)   Pap smear action/deferral: GYN Referral  (01/19/2010)    Mammogram: ASSESSMENT: Negative - BI-RADS 1^MM DIGITAL SCREENING  (04/17/2009)   Mammogram action/deferral: Ordered  (01/19/2010)    DXA bone density scan: Lumbar Spine:  T Score< -2.5 Spine.   Hip Total: T Score -2.5 to -1.0 Hip.      (11/13/2007)   DXA bone density action/deferral: Deferred  (01/19/2010)   Smoking  status: never  (01/19/2010)  Diabetes Mellitus   HgbA1C: 6.6  (01/09/2010)    Eye exam: No diabetic retinopathy.   Cataract.     (04/11/2008)   Diabetic eye exam action/deferral: Ophthalmology referral  (01/19/2010)   Eye exam due: 04/2009    Foot exam: yes  (01/19/2010)   Foot exam action/deferral: Do today   High risk foot: No  (01/19/2010)   Foot care education: Done  (01/19/2010)    Urine microalbumin/creatinine ratio: 3.5  (07/25/2008)   Urine microalbumin action/deferral: Ordered    Diabetes flowsheet reviewed?: Yes   Progress toward A1C goal: At goal  Lipids   Total Cholesterol: 133  (07/25/2008)   LDL: 77  (07/25/2008)   LDL Direct: Not documented   HDL: 50  (07/25/2008)   Triglycerides: 31  (07/25/2008)    SGOT (AST): 19  (12/03/2008)   BMP action: Ordered   SGPT (ALT): 16  (12/03/2008)   Alkaline phosphatase: 44  (12/03/2008)   Total bilirubin: 0.9  (12/03/2008)    Lipid flowsheet reviewed?: Yes   Progress toward LDL goal: At goal  Hypertension   Last Blood Pressure: 158 / 80  (01/19/2010)   Serum creatinine: 0.86  (12/03/2008)   BMP action: Ordered   Serum potassium 4.6  (12/03/2008)    Hypertension flowsheet reviewed?: Yes   Progress toward BP goal: Unchanged  Self-Management Support :   Personal Goals (by the next clinic visit) :     Personal A1C goal: 7  (01/09/2010)     Personal blood pressure goal: 130/80  (01/09/2010)      Personal LDL goal: 100  (01/09/2010)    Patient will work on the following items until the next clinic visit to reach self-care goals:     Medications and monitoring: take my medicines every day, check my blood pressure, bring all of my medications to every visit  (01/19/2010)     Eating: drink diet soda or water instead of juice or soda, eat more vegetables, eat fruit for snacks and desserts  (01/19/2010)     Activity: take a 30 minute walk every day  (01/19/2010)    Diabetes self-management support: Written self-care plan, Referred for DM self-management training  (01/19/2010)   Diabetes care plan printed   Last diabetes self-management training by diabetes educator: 08/09/2007   Referred for diabetes self-mgmt training.   Last medical nutrition therapy: 09/14/2007    Hypertension self-management support: Written self-care plan  (01/19/2010)   Hypertension self-care plan printed.    Lipid self-management support: Written self-care plan  (01/19/2010)   Lipid self-care plan printed.   Nursing Instructions: Diabetic foot exam today Gyn referral for screening Pap (see order) GI referral for screening colonoscopy (see order) Refer for screening diabetic eye exam (see order) Refer for diabetes self-management training (see order)    Process Orders Check Orders Results:     [facility]: ABN not required for this insurance Tests Sent for requisitioning (January 19, 2010 3:39 PM):     01/19/2010: [facility] -- T-CMP with Estimated GFR [[id number]] (signed)     01/19/2010: [facility] -- T-Lipid Profile [phone] (signed)     01/19/2010: [facility] -- T-CBC No Diff [[id number]] (signed)     01/19/2010: [facility] -- T-Urine Microalbumin w/creat. ratio [[id number]] (signed)     02/09/2010: [facility] -- T-Hemoccult Cards-Multiple [[id number]] (signed)    Diabetes Self Management  Training Referral Patient Name: Connie Powell Date Of Birth: [birth year] MRN: [id number]  Current Diagnosis:  SPECIAL SCREENING FOR MALIGNANT NEOPLASMS COLON (ICD-V76.51) SCREENING FOR MALIGNANT NEOPLASM OF THE CERVIX (ICD-V76.2) ABNORMAL MAMMOGRAM (ICD-793.80) URINARY FREQUENCY (ICD-788.41) SYNCOPE, HX OF (ICD-V12.49) OSTEOPOROSIS (ICD-733.00) CHRONIC GINGIVITIS PLAQUE INDUCED (ICD-523.10) LEUKOPENIA, MILD (ICD-288.50) THROMBOCYTOPENIA (ICD-287.5) HEALTH SCREENING (ICD-V70.0) DISORDER, DENTAL NOS (ICD-525.9) HYPERLIPIDEMIA (ICD-272.4) ABFND PAP SMEAR LGSIL (ICD-795.03) COUGH, CHRONIC (ICD-786.2) ABNRM FINDINGS, TB SKIN TEST W/O ACTIVE TB (ICD-795.5) HYPERTENSION (ICD-401.9) DIABETES MELLITUS, TYPE II (ICD-250.00) CHOLECYSTECTOMY, HX OF (ICD-V45.79) PANCREATITIS, HX OF (ICD-V12.70)     Management Training Needs:   Follow-up DSMT(2 hours/year)  Complicating Conditions:  HTN

## 2010-08-04 NOTE — Assessment & Plan Note (Signed)
Summary: EST-F/U VISIT/CH   Vital Signs:  Patient profile:   70 year old female Height:      60.25 inches (153.04 cm) Weight:      119.7 pounds (54.41 kg) BMI:     23.27 Temp:     98.6 degrees F (37.00 degrees C) oral Pulse rate:   74 / minute BP sitting:   134 / 71  (left arm) Cuff size:   regular  Vitals Entered By: Cynda Familia Duncan Dull) (February 09, 2010 2:08 PM) CC: f/u htn, discuss metformin dose Is Patient Diabetic? Yes Did you bring your meter with you today? No Pain Assessment Patient in pain? no      Nutritional Status BMI of 19 -24 = normal CBG Result 170  Have you ever been in a relationship where you felt threatened, hurt or afraid?No   Does patient need assistance? Functional Status Self care Ambulation Normal    Primary Care Provider:  Lars Mage MD  CC:  f/u htn and discuss metformin dose.  History of Present Illness: Patient is 70 year old women with PMH as described in EMR is here today for a follow up for her BP and a repeat Bmet since she was recently started on Lisinopril.  Patient does not speak english and her daughterwho is the caregiver is acting as a Nurse, learning disability for her.  She reports having cough since last 3 weeks which is dry. There is no particular time of the day that she has this and she does not have associated fevers or chills.  Her BP is well controlled today.  Her DM is well controlled with last Hba1c 6.4 on 01/2010. She says that she was told to pay 3000$ for cataract surgery by her regular Opthalmologist and she does not have that kinda money.   He denies any new sicknesses or hospitalizations, no chest pain episodes, no fevers, no chills, no abdominal or urinary concerns. No recent changes in appetite, weight.   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 Take 1 Tablet By Mouth Once A Day 2)  Calcium 500/d 500-200 Mg-Unit Tabs (Calcium Carbonate-Vitamin D) .... Take 1 Tablet By Mouth Three Times A Day 3)  Lisinopril 10 Mg Tabs (Lisinopril) .... Take 1 Tablet By Mouth Once A Day 4)  Metformin Hcl 500 Mg Tabs (Metformin Hcl) .... Start With 1/2 Tab A Day and Then After 1 Week Take 1/2 Tab Twice Daily For Next 2 Weeks and Then Take 1000mg  Tab Twice Daily  Current Medications (verified): 1)  Zocor 40 Mg Tabs (Simvastatin) .... Take 1 Tablet By Mouth Once A Day 2)  Calcium 500/d 500-200 Mg-Unit Tabs (  Calcium Carbonate-Vitamin D) .... Take 1 Tablet By Mouth Three Times A Day 3)  Metformin Hcl 500 Mg Tabs (Metformin Hcl) .... Start With 1/2 Tab A Day and Then After 1 Week Take 1/2 Tab Twice Daily For Next 2 Weeks and Then Take 1000mg  Tab Twice Daily 4)  Losartan Potassium 25 Mg Tabs (Losartan Potassium) .... 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 PAP  in 05/07 showed LGSIL Nornal screening colonoscopy 2011 Chronic red eyes    - referred to ophtalmology Osteoporosis (Dexa 4/09)    - starting alendronate 6/09  Past Surgical History: Last updated: 04/20/2006 Cholecystectomy  Social History: Last updated: 02/29/2008 Moved here from [place] in  ~2004. Widowed. No smoking, etoh, or illegal drugs. Lives wit her daughter.  Risk Factors: Alcohol Use: 0 (02/09/2010) Exercise: yes (01/09/2010)  Risk Factors: Smoking Status: never (02/09/2010)  Social History: Reviewed history from 02/29/2008 and no changes required. Moved here from [place] in  ~2004. Widowed. No smoking, etoh, or illegal drugs. Lives wit her daughter.  Review of Systems      See HPI  Physical Exam  Additional Exam:  Gen: AOx3, in no acute distress Eyes: PERRL, EOMI ENT:MMM, No erythema noted in posterior pharynx Neck: No JVD, No LAP Chest: CTAB with  good respiratory effort CVS: regular rhythmic rate, NO M/R/G, S1 S2 normal Abdo: soft,ND, BS+x4, Non tender and No hepatosplenomegaly EXT: No odema noted Neuro: Non focal, gait is normal Skin: no rashes noted.    Impression & Recommendations:  Problem # 1:  HYPERTENSION (ICD-401.9) Assessment Improved Her BP is well controlled on Lisinopril but she reports that she started having cough on this. I will start her on Cozaar today. Daughter says that cost should not be problem and she can afford $50. I will check bmet today for K and Crt.  The following medications were removed from the medication list:    Lisinopril 10 Mg Tabs (Lisinopril) .[name]... Take 1 tablet by mouth once a day Her updated medication list for this problem includes:    Losartan Potassium 25 Mg Tabs (Losartan potassium) ..... Once daily  Orders: T-Basic Metabolic Panel [phone])  BP today: 134/71 Prior BP: 158/80 (01/19/2010)  Labs Reviewed: K+: 4.1 (01/19/2010) Creat: : 0.80 (01/19/2010)   Chol: 170 (01/19/2010)   HDL: 38  (01/19/2010)   LDL: 118 (01/19/2010)   TG: 69 (01/19/2010)  Problem # 2:  DIABETES MELLITUS, TYPE II (ICD-250.00) Assessment: Comment Only At goal. She will see Jamison Neighbor today. eye exam ispending but she does not want to see her regular doctor as she has cataract. I will send her information to [facility] hospital to get her an appointment for cataract removal.  The following medications were removed from the medication list:    Lisinopril 10 Mg Tabs (Lisinopril) .[name]... Take 1 tablet by mouth once a day Her updated medication list for this problem includes:    Metformin Hcl 500 Mg Tabs (Metformin hcl) ..... Start with 1/2 tab a day and then after 1 week take 1/2 tab twice daily for next 2 weeks and then take 1000mg  tab twice daily    Losartan Potassium 25 Mg  Tabs (Losartan potassium) ..... Once daily  Labs Reviewed: Creat: 0.80 (01/19/2010)     Last Eye Exam: No diabetic retinopathy.   Cataract.    (04/11/2008) Reviewed HgBA1c results: 6.6 (01/09/2010)  6.4 (12/03/2008)  Problem # 3:  OSTEOPOROSIS (ICD-733.00) Assessment: Comment Only Patient's T score was <-2.5 for lumbar spine. She got 3 months of alendronate and now only on VitD/Calcium. She does not smoke and exercises as walks everyday and does household work. We may consider putting her back on Alendronate as she recieved just 3 months of therapy and stopped due to patient preferance. i will discuss tghis the next visit.  Discussed medication use, applications of heat or ice, and exercises.   Problem # 4:  ABFND PAP SMEAR LGSIL (ICD-795.03) Assessment: New Follow up with Gyn doctor as appropriate.  Problem # 5:  Preventive Health Care (ICD-V70.0) Assessment: Comment Only Tdap pneumovax Colonoscopy done last week>will requests results Pap smear shows LGCIL> follow up with Gyn  Complete Medication List: 1)  Zocor 40 Mg Tabs (Simvastatin) .... Take 1 tablet by mouth once a day 2)  Calcium 500/d 500-200 Mg-unit Tabs  (Calcium carbonate-vitamin d) .... Take 1 tablet by mouth three times a day 3)  Metformin Hcl 500 Mg Tabs (Metformin hcl) .... Start with 1/2 tab a day and then after 1 week take 1/2 tab twice daily for next 2 weeks and then take 1000mg  tab twice daily 4)  Losartan Potassium 25 Mg Tabs (Losartan potassium) .... Once daily  Other Orders: Tdap => 80yrs IM (01027) Pneumococcal Vaccine (25366) Admin 1st Vaccine (44034) Admin of Any Addtl Vaccine (74259)  Patient Instructions: 1)  Please schedule a follow-up appointment in 1 month. 2)  It is important that you exercise regularly at least 20 minutes 5 times a week. If you develop chest pain, have severe difficulty breathing, or feel very tired , stop exercising immediately and seek medical attention. 3)  It is important that your Diabetic A1c level is checked every 3 months. 4)  See your eye doctor yearly to check for diabetic eye damage. 5)  Check your Blood Pressure regularly. If it is above:160/100 you should make an appointment. Prescriptions: LOSARTAN POTASSIUM 25 MG TABS (LOSARTAN POTASSIUM) once daily  #31 x 11   Entered and Authorized by:   Lars Mage MD   Signed by:   Lars Mage MD on 02/09/2010   Method used:   Electronically to        [facility] [address] (retail)       [phone]       [place], [place]  [address]       Ph: [phone]       Fax: [phone]   RxID:   [id number]  Process Orders Check Orders Results:     [facility]: ABN not required for this insurance Tests Sent for requisitioning (February 10, 2010 12:07 PM):     02/09/2010: [facility] -- T-Basic Metabolic Panel [phone] (signed)     Prevention & Chronic Care Immunizations   Influenza vaccine: Not documented   Influenza vaccine deferral: Deferred  (02/09/2010)    Tetanus booster: 02/09/2010: Tdap    Pneumococcal vaccine: Pneumovax  (02/09/2010)   Pneumococcal vaccine  deferral: Deferred  (01/09/2010)    H. zoster vaccine: Not documented   H. zoster vaccine deferral: Deferred  (01/09/2010)  Colorectal Screening   Hemoccult: Not documented   Hemoccult action/deferral: Deferred  (  01/19/2010)    Colonoscopy: Not documented   Colonoscopy action/deferral: GI referral  (01/19/2010)  Other Screening   Pap smear: NEGATIVE FOR INTRAEPITHELIAL LESIONS OR MALIGNANCY. BENIGN REACTIVE/REPARATIVE CHANGES.  (02/02/2010)   Pap smear action/deferral: GYN Referral  (01/19/2010)    Mammogram: ASSESSMENT: Negative - BI-RADS 1^MM DIGITAL SCREENING  (04/17/2009)   Mammogram action/deferral: Ordered  (01/19/2010)    DXA bone density scan: Lumbar Spine:  T Score< -2.5 Spine.   Hip Total: T Score -2.5 to -1.0 Hip.      (11/13/2007)   DXA bone density action/deferral: Deferred  (02/09/2010)  Reports requested:   Last colonoscopy report requested.  Smoking status: never  (02/09/2010)  Diabetes Mellitus   HgbA1C: 6.6  (01/09/2010)    Eye exam: No diabetic retinopathy.   Cataract.     (04/11/2008)   Diabetic eye exam action/deferral: Ophthalmology referral  (01/19/2010)   Eye exam due: 04/2009    Foot exam: yes  (01/19/2010)   Foot exam action/deferral: Do today   High risk foot: No  (01/19/2010)   Foot care education: Done  (01/19/2010)    Urine microalbumin/creatinine ratio: 3.3  (01/19/2010)   Urine microalbumin action/deferral: Ordered    Diabetes flowsheet reviewed?: Yes   Progress toward A1C goal: At goal  Lipids   Total Cholesterol: 170  (01/19/2010)   LDL: 118  (01/19/2010)   LDL Direct: Not documented   HDL: 38  (01/19/2010)   Triglycerides: 69  (01/19/2010)    SGOT (AST): 16  (01/19/2010)   BMP action: Ordered   SGPT (ALT): 13  (01/19/2010)   Alkaline phosphatase: 53  (01/19/2010)   Total bilirubin: 1.2  (01/19/2010)    Lipid flowsheet reviewed?: Yes   Progress toward LDL goal: Unchanged  Hypertension   Last Blood Pressure: 134 / 71   (02/09/2010)   Serum creatinine: 0.80  (01/19/2010)   BMP action: Ordered   Serum potassium 4.1  (01/19/2010)    Hypertension flowsheet reviewed?: Yes   Progress toward BP goal: Improved  Self-Management Support :   Personal Goals (by the next clinic visit) :     Personal A1C goal: 7  (01/09/2010)     Personal blood pressure goal: 130/80  (01/09/2010)     Personal LDL goal: 100  (01/09/2010)    Diabetes self-management support: Written self-care plan  (02/09/2010)   Diabetes care plan printed   Last diabetes self-management training by diabetes educator: 08/09/2007   Last medical nutrition therapy: 09/14/2007    Hypertension self-management support: Written self-care plan  (02/09/2010)   Hypertension self-care plan printed.    Lipid self-management support: Written self-care plan  (02/09/2010)   Lipid self-care plan printed.   Nursing Instructions: Give tetanus booster today Give Pneumovax today Request report of last colonoscopy      Process Orders Check Orders Results:     [facility]: ABN not required for this insurance Tests Sent for requisitioning (February 10, 2010 12:07 PM):     02/09/2010: [facility] -- T-Basic Metabolic Panel [phone] (signed)    Laboratory Results   Blood Tests   Date/Time Received: February 09, 2010 2:54 PM  Date/Time Reported: Burke Keels  February 09, 2010 2:54 PM   CBG Random:: 170mg /dL      Immunizations Administered:  Tetanus Vaccine:    Vaccine Type: Tdap    Site: right deltoid    Mfr: GlaxoSmithKline    Dose: 0.5 ml    Route: IM    Given  by: Cynda Familia (AAMA)    Exp. Date: 01/02/2012    Lot #: [id number]    VIS given: 05/23/07 version given February 09, 2010.  Pneumonia Vaccine:    Vaccine Type: Pneumovax    Site: left deltoid    Mfr: Merck    Dose: 0.5 ml    Route: IM    Given by: Cynda Familia (AAMA)    Exp. Date: 07/22/2011    Lot #: [id number]    VIS given:  01/31/96 version given February 09, 2010.

## 2010-08-04 NOTE — Progress Notes (Signed)
Summary: colonoscopy referral update//kg  ---- Converted from flag ---- ---- 05/04/2010 2:36 PM, Lars Mage MD wrote: We can hold of on repeat colonoscopy until she is seen in the office.  thanks.  ankit  ---- 05/01/2010 4:10 PM, Cynda Familia (AAMA) wrote: Just following up on this pt, she had repeat stool cards that were negative, wasn't sure if you still wanted a colonoscopy or not, please advise.  Thanks Purnell Shoemaker  [phone] ------------------------------

## 2010-08-04 NOTE — Letter (Signed)
Summary: Generic Letter  [facility]  [address]   [place], [place] [address]   Phone: [phone]  Fax: [phone]    04/09/2010  [facility] [address] [place], [place]  [address]  Dear Ms. Serio,   Dr Eben Burow has requested that you repeat this test.  I attempted to contact you at [phone], but did not get an answer.  Please contact me at [phone] and I will explain to you and your family as to why another collection is needed. Please complete the enclosed stool collection kit the same way you did the last time and mail or bring back to our office. Thank you.   Sincerely,     Cynda Familia (AAMA)

## 2010-08-04 NOTE — Assessment & Plan Note (Signed)
Summary: RA/NEEDS ROUTINE CHECKUP/CH   Vital Signs:  Patient profile:   70 year old female Height:      60.25 inches (153.04 cm) Weight:      121.5 pounds (55.23 kg) BMI:     23.62 Temp:     97.7 degrees F (36.50 degrees C) oral Pulse rate:   74 / minute BP sitting:   155 / 77  (right arm) Cuff size:   regular  Vitals Entered By: Theotis Barrio NT II (January 09, 2010 11:13 AM) CC: MEDICATION REFILL / YEARLY CLINIC CHECK UP Is Patient Diabetic? No Pain Assessment Patient in pain? no      Nutritional Status BMI of 19 -24 = normal  Have you ever been in a relationship where you felt threatened, hurt or afraid?No   Does patient need assistance? Functional Status Self care, Social activities Ambulation Normal Comments HERE FOR YEARLY CLINIC CHECK UP    Primary Care Provider:  Lars Mage MD  CC:  MEDICATION REFILL / YEARLY CLINIC CHECK UP.  History of Present Illness: Connie Powell is a 70 year old women with PMH as described in EMR is here todayfor a yearly follow up.  Her DM is well controlled with last Hba1c 6.4, I will get an Hba1c when she comes for lab draw. Opthal exam pending to be done in next visit.  Her colonoscopy was done [facility] Dr Matthias Hughs and it is normal.  Mammogram done last year and is normal.  BP is little uncontrolled today and I will follow up after 2 weeks with restart meds today.  Hyperlipidemia, I refilled her meds and will check FLP with next blood draw.   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 Take 1 Tablet By Mouth Once A Day 2)  Calcium 500/d 500-200 Mg-Unit Tabs (Calcium Carbonate-Vitamin D) .... Take 1 Tablet By Mouth Three Times A Day 3)  Lisinopril 10 Mg Tabs (Lisinopril) .... Take 1 Tablet By Mouth Once A Day 4)  Glipizide 2.5 Mg Xr24h-Tab (Glipizide) .... Take 1 Tablet By Mouth Once A Day 5)  Alendronate Sodium 70 Mg Tabs (Alendronate Sodium) .... Take One Tablet By Mouth With A Full Glass of Water On An Empty Stomach Once A Week.  Current Medications (verified): 1)  Zocor 40 Mg Tabs (Simvastatin) .... Take 1 Tablet By Mouth Once A Day 2)  Calcium 500/d 500-200 Mg-Unit Tabs (Calcium Carbonate-Vitamin D) .... Take 1 Tablet By Mouth Three Times A Day 3)  Lisinopril 10 Mg Tabs (Lisinopril) .... Take 1 Tablet By Mouth Once  A Day 4)  Metformin Hcl 500 Mg Tabs (Metformin Hcl) .... 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 PAP in 05/07 showed LGSIL Nornal screening colonoscopy 2007 Chronic red eyes    - referred to ophtalmology Osteoporosis (Dexa 4/09)    - starting alendronate  6/09  Past Surgical History: Last updated: 04/20/2006 Cholecystectomy  Social History: Last updated: 02/29/2008 Moved here from [place] in  ~2004. Widowed. No smoking, etoh, or illegal drugs. Lives wit her daughter.  Risk Factors: Exercise: yes (01/09/2010)  Risk Factors: Smoking Status: never (01/09/2010)  Social History: Reviewed history from 02/29/2008 and no changes required. Moved here from [place] in  ~2004. Widowed. No smoking, etoh, or illegal drugs. Lives wit her daughter.  Review of Systems      See HPI  Physical Exam  Additional Exam:  Gen: AOx3, in no acute distress Eyes: PERRL, EOMI ENT:MMM, No erythema noted in posterior pharynx Neck: No JVD, No LAP Chest: CTAB with  good respiratory effort CVS: regular rhythmic rate, NO M/R/G, S1 S2 normal Abdo: soft,ND, BS+x4, Non tender and No hepatosplenomegaly EXT: No odema noted Neuro: Non focal, gait is normal Skin: no rashes noted.    Impression & Recommendations:  Problem # 1:  HYPERTENSION (ICD-401.9) Assessment Deteriorated  Her updated medication list Reatsrt BP meds today and recheck in 2 weeks time.  t for this problem includes:    Lisinopril 10 Mg Tabs (Lisinopril) .[name]... Take 1 tablet by mouth once a day  Future Orders: T-Basic Metabolic Panel [id number]) ... 01/16/2010  BP today: 155/77 Prior BP: 140/70 (12/03/2008)  Labs Reviewed: K+: 4.6 (12/03/2008) Creat: : 0.86 (12/03/2008)   Chol: 133 (07/25/2008)   HDL: 50 (07/25/2008)   LDL: 77 (07/25/2008)   TG: 31 (07/25/2008)  Her updated medication list for this problem includes:    Lisinopril 10 Mg Tabs (Lisinopril) .[name]... Take 1 tablet by mouth once a day  Problem # 2:  DIABETES MELLITUS, TYPE II (ICD-250.00) Assessment: Comment Only her eye exam due in next visit, she was well controlled with Hba1c 6.4 the last check. She has been glipizide and asks for a cheaper drug, I will start metfromin in her which is $4, her crt is 1.2.   The  following medications were removed from the medication list:    Glipizide 2.5 Mg Xr24h-tab (Glipizide) .[name]... Take 1 tablet by mouth once a day Her updated medication list for this problem includes:    Lisinopril 10 Mg Tabs (Lisinopril) .[name]... Take 1 tablet by mouth once a day    Metformin Hcl 500 Mg Tabs (Metformin hcl) ..... Start with 1/2 tab a day and then after 1 week take 1/2 tab twice daily for next 2 weeks and then take 1000mg  tab twice daily  Orders: T-Hgb A1C (in-house) ([id number])  Labs Reviewed: Creat: 0.86 (12/03/2008)     Last Eye Exam: No diabetic retinopathy.   Cataract.    (04/11/2008) Reviewed HgBA1c results: 6.6 (01/09/2010)  6.4 (12/03/2008)  Problem # 3:  HYPERLIPIDEMIA (ICD-272.4)  Assessment: Comment Only Check FLP. Her updated medication list for this problem includes:    Zocor 40 Mg Tabs (Simvastatin) .[name]... Take 1 tablet by mouth once a day  Future Orders: T-Lipid Profile ([id number]) ... 01/16/2010  Labs Reviewed: SGOT: 19 (12/03/2008)   SGPT: 16 (12/03/2008)   HDL:50 (07/25/2008), 45 (02/29/2008)  LDL:77 (07/25/2008), 127 (09/81/1914)  Chol:133 (07/25/2008), 187 (02/29/2008)  Trig:31 (07/25/2008), 75 (02/29/2008)  Problem # 4:  OSTEOPOROSIS (ICD-733.00) Assessment: Comment Only Completed treatment for 3 months last year. The following medications were removed from the medication list:    Alendronate Sodium 70 Mg Tabs (Alendronate sodium) .[name]... Take one tablet by mouth with a full glass of water on an empty stomach once a week.  Discussed medication use, applications of heat or ice, and exercises.   Problem # 5:  Preventive Health Care (ICD-V70.0) Assessment: Comment Only Colonoscopy, mammogram uptodate. Pap smear due.  Complete Medication List: 1)  Zocor 40 Mg Tabs (Simvastatin) .... Take 1 tablet by mouth once a day 2)  Calcium 500/d 500-200 Mg-unit Tabs (Calcium carbonate-vitamin d) .... Take 1 tablet by mouth three times a day 3)  Lisinopril 10  Mg Tabs (Lisinopril) .... Take 1 tablet by mouth once a day 4)  Metformin Hcl 500 Mg Tabs (Metformin hcl) .... Start with 1/2 tab a day and then after 1 week take 1/2 tab twice daily for next 2 weeks and then take 1000mg  tab twice daily  Patient Instructions: 1)  Please come next week on 01/16/2010 for lab draw. 2)  It is important that you exercise regularly at least 20 minutes 5 times a week. If you develop chest pain, have severe difficulty breathing, or feel very tired , stop exercising immediately and seek medical attention. 3)  You need to lose weight. Consider a lower calorie diet and regular exercise.  4)  Take an Aspirin every day. 5)  Check your Blood Pressure regularly. If it is above: you should make an appointment. 6)  Please schedule a follow-up appointment in 2 weeks. Prescriptions: LISINOPRIL 10 MG TABS (LISINOPRIL) Take 1 tablet by mouth once a day  #30 x 5   Entered and Authorized by:   Lars Mage MD   Signed by:   Lars Mage MD on 01/09/2010   Method used:   Print then Give to Patient   RxID:   [id number] CALCIUM 500/D 500-200 MG-UNIT TABS (CALCIUM CARBONATE-VITAMIN D) Take 1 tablet by mouth three times a day  #90 x 5   Entered and Authorized by:   Lars Mage MD   Signed by:   Lars Mage MD on 01/09/2010   Method used:   Print then Give to Patient   RxID:   [id number] ZOCOR 40 MG TABS (SIMVASTATIN) Take 1 tablet by mouth once a day  #31 x 5   Entered and Authorized by:   Lars Mage MD   Signed by:   Lars Mage MD on 01/09/2010   Method used:   Print then Give to Patient   RxID:   [id number] METFORMIN HCL 500 MG TABS (METFORMIN HCL) start with 1/2 tab a day and then after 1 week take 1/2 tab twice daily for next 2 weeks and then take 1000mg  tab twice daily  #60 x 11   Entered and Authorized by:   Lars Mage MD   Signed by:   Lars Mage MD on 01/09/2010   Method used:   Print then Give to Patient   RxID:   [id number]  Process Orders Check  Orders Results:     [facility]: ABN not required for this insurance Tests Sent for requisitioning (January 09, 2010 1:11 PM):     01/16/2010: [facility] -- T-Basic Metabolic Panel [id number] (signed)     01/16/2010: [facility] -- T-Lipid Profile [phone] (signed)    Prevention & Chronic Care Immunizations   Influenza vaccine: Not documented   Influenza vaccine deferral: Deferred  (01/09/2010)    Tetanus booster: Not documented    Pneumococcal vaccine: Not documented   Pneumococcal vaccine deferral: Deferred  (01/09/2010)    H. zoster vaccine: Not documented   H. zoster vaccine deferral: Deferred  (01/09/2010)  Colorectal Screening   Hemoccult: Not documented   Hemoccult action/deferral: Deferred  (01/09/2010)    Colonoscopy: Not documented   Colonoscopy action/deferral: Deferred  (01/09/2010)  Other Screening   Pap smear: LOW GRADE SQUAMOUS INTRAEPITHELIAL LESION: CIN-1/ VAIN-1/  (09/13/2008)   Pap smear action/deferral: GYN referral  (01/09/2010)    Mammogram: BI-RADS CATEGORY 2:  Benign finding(s).^MM DIGITAL DIAGNOSTIC UNILAT L  (06/18/2008)   Mammogram action/deferral: Deferred-2 yr interval  (01/09/2010)    DXA bone density scan: Lumbar Spine:  T Score< -2.5 Spine.   Hip Total: T Score -2.5 to -1.0 Hip.      (11/13/2007)   DXA bone density action/deferral: Deferred  (01/09/2010)  Reports requested:   Last colonoscopy report requested.   Last mammogram report requested.  Smoking status: never  (01/09/2010)  Diabetes Mellitus   HgbA1C: 6.6  (01/09/2010)    Eye exam: No diabetic retinopathy.   Cataract.     (04/11/2008)   Diabetic eye exam action/deferral: Deferred  (01/09/2010)   Eye exam due: 04/2009    Foot exam: yes  (07/25/2008)   Foot exam action/deferral: Deferred   High risk foot: Not documented   Foot care education: Not documented    Urine microalbumin/creatinine ratio: 3.5  (07/25/2008)    Urine microalbumin action/deferral: Deferred    Diabetes flowsheet reviewed?: Yes   Progress toward A1C goal: At goal  Lipids   Total Cholesterol: 133  (07/25/2008)   LDL: 77  (07/25/2008)   LDL Direct: Not documented   HDL: 50  (07/25/2008)   Triglycerides: 31  (07/25/2008)    SGOT (AST): 19  (12/03/2008)   BMP action: Deferred   SGPT (ALT): 16  (12/03/2008)   Alkaline phosphatase: 44  (12/03/2008)   Total bilirubin: 0.9  (12/03/2008)    Lipid flowsheet reviewed?: Yes   Progress toward LDL goal: At goal  Hypertension   Last Blood Pressure: 155 / 77  (01/09/2010)   Serum creatinine: 0.86  (12/03/2008)   Serum potassium 4.6  (12/03/2008)    Hypertension flowsheet reviewed?: Yes   Progress toward BP goal: Deteriorated  Self-Management Support :   Personal Goals (by the next clinic visit) :     Personal A1C goal: 7  (01/09/2010)     Personal blood pressure goal: 130/80  (01/09/2010)     Personal LDL goal: 100  (01/09/2010)    Patient will work on the following items until the next clinic visit to reach self-care goals:     Medications and monitoring: take my medicines every day, check my blood sugar, bring all of my medications to every visit, examine my feet every day  (01/09/2010)     Eating: drink diet soda or water instead of juice or soda, eat more vegetables, use fresh or frozen vegetables, eat foods that are low in  salt, eat baked foods instead of fried foods, eat fruit for snacks and desserts, limit or avoid alcohol  (01/09/2010)     Activity: take a 30 minute walk every day  (01/09/2010)    Diabetes self-management support: Written self-care plan  (01/09/2010)   Diabetes care plan printed   Last diabetes self-management training by diabetes educator: 08/09/2007   Last medical nutrition therapy: 09/14/2007    Hypertension self-management support: Written self-care plan  (01/09/2010)   Hypertension self-care plan printed.    Lipid self-management support: Written  self-care plan  (01/09/2010)   Lipid self-care plan printed.   Nursing Instructions: Give tetanus booster today Request report of last colonoscopy Request report of last mammogram     Process Orders Check Orders Results:     [facility]: ABN not required for this insurance Tests Sent for requisitioning (January 09, 2010 1:11 PM):     01/16/2010: [facility] -- T-Basic Metabolic Panel [id number] (signed)     01/16/2010: [facility] -- T-Lipid Profile [phone] (signed)   Laboratory Results   Blood Tests   Date/Time Received: January 09, 2010 11:58 AM Date/Time Reported: Alric Quan  January 09, 2010 11:58 AM   HGBA1C: 6.6%   (Normal Range: Non-Diabetic - 3-6%   Control Diabetic - 6-8%)

## 2010-08-12 NOTE — Progress Notes (Signed)
  Phone Note Outgoing Call   Call placed by: Theotis Barrio NT II,  August 04, 2010 3:10 PM Call placed to: Patient Details for Reason: [facility] Summary of Call: CALLED PATIENT NO ANSWER.  THIS CALL WAS TO GET PATIENT SCHEDULED FOR DM EYE EXAM.  WILL CALL AGAIN.

## 2010-08-28 ENCOUNTER — Ambulatory Visit: Payer: Self-pay | Admitting: Advanced Practice Midwife

## 2010-09-16 ENCOUNTER — Ambulatory Visit: Payer: Self-pay | Admitting: Family Medicine

## 2010-09-18 LAB — GLUCOSE, CAPILLARY: Glucose-Capillary: 170 mg/dL — ABNORMAL HIGH (ref 70–99)

## 2010-09-20 LAB — GLUCOSE, CAPILLARY: Glucose-Capillary: 104 mg/dL — ABNORMAL HIGH (ref 70–99)

## 2010-09-24 ENCOUNTER — Encounter: Payer: Self-pay | Admitting: Internal Medicine

## 2010-09-28 ENCOUNTER — Ambulatory Visit (INDEPENDENT_AMBULATORY_CARE_PROVIDER_SITE_OTHER): Payer: Self-pay | Admitting: Internal Medicine

## 2010-09-28 ENCOUNTER — Encounter: Payer: Self-pay | Admitting: Internal Medicine

## 2010-09-28 VITALS — BP 166/76 | HR 71 | Temp 98.6°F | Ht 61.2 in | Wt 116.5 lb

## 2010-09-28 DIAGNOSIS — E119 Type 2 diabetes mellitus without complications: Secondary | ICD-10-CM

## 2010-09-28 DIAGNOSIS — H269 Unspecified cataract: Secondary | ICD-10-CM

## 2010-09-28 DIAGNOSIS — I10 Essential (primary) hypertension: Secondary | ICD-10-CM

## 2010-09-28 DIAGNOSIS — E785 Hyperlipidemia, unspecified: Secondary | ICD-10-CM

## 2010-09-28 LAB — GLUCOSE, CAPILLARY: Glucose-Capillary: 135 mg/dL — ABNORMAL HIGH (ref 70–99)

## 2010-09-28 LAB — POCT GLYCOSYLATED HEMOGLOBIN (HGB A1C): Hemoglobin A1C: 6.1

## 2010-09-28 MED ORDER — CALCIUM CARBONATE-VITAMIN D 500-200 MG-UNIT PO TABS: 1.0000 | ORAL_TABLET | Freq: Every day | ORAL | Status: DC

## 2010-09-28 MED ORDER — LOSARTAN POTASSIUM 25 MG PO TABS: 25.0000 mg | ORAL_TABLET | Freq: Every day | ORAL | Status: DC

## 2010-09-28 MED ORDER — SIMVASTATIN 40 MG PO TABS: 40.0000 mg | ORAL_TABLET | Freq: Every day | ORAL | Status: DC

## 2010-09-28 MED ORDER — METFORMIN HCL 1000 MG PO TABS: 1000.0000 mg | ORAL_TABLET | Freq: Two times a day (BID) | ORAL | Status: DC

## 2010-09-28 NOTE — Assessment & Plan Note (Signed)
Patient's last lipid profile checked was 8 months ago and her LDL was above goal at 118. Patient has not been taking her medications for at least one month. I would not check lipid profile at this point of time. Would continue to follow the patient and probably have her back in one month and repeat lipid profile.

## 2010-09-28 NOTE — Patient Instructions (Signed)

## 2010-09-28 NOTE — Assessment & Plan Note (Signed)
We've provided patient with a few numbers of ophthalmology offices were to consider patient for a free clinic. I will have the patient back in one month and call up the ophthalmology office personally if this is not taken care of the next one month.

## 2010-09-28 NOTE — Progress Notes (Signed)
  Subjective:    Patient ID: Connie Powell, female    DOB: 03-17-1941, 70 y.o.   MRN: [id number]  HPI patient is a 70 year old female patient of mine who is here today for a regular followup appointment.   Patient has not been taking her blood pressure or her cholesterol medications since last at least one month. Patient is accompanied by her granddaughter today who states that she's cannot go directly to the drug store and get her grandmother called medications that she needs. Patient's blood pressure is a little high today which is understandable I she's not been taking her blood pressure for last more than one month.   Patient's HbA1c is 6.1 today which is excellent. I would continue the patient on metformin twice a day thousand milligrams.   Patient complaints of eye problems and patient has bilateral cataracts but has not been able to seek eye doctor who would extract her cataract at low cost. Patient was referred to [facility] who asked her for $1000 and she did not get her cataract done. We are getting social work services to get the patient into an ophthalmologist office to get the cataract out.   patient is up-to-date on her labs.     Review of Systems  Constitutional: Negative for fever, activity change and appetite change.  HENT: Negative for sore throat.   Respiratory: Negative for cough and shortness of breath.   Cardiovascular: Negative for chest pain and leg swelling.  Gastrointestinal: Negative for nausea, abdominal pain, diarrhea, constipation and abdominal distention.  Genitourinary: Negative for frequency, hematuria and difficulty urinating.  Neurological: Negative for dizziness and headaches.  Psychiatric/Behavioral: Negative for suicidal ideas and behavioral problems.     Objective:   Physical Exam  Constitutional: She is oriented to person, place, and time. She appears well-developed and well-nourished.  HENT:  Head: Normocephalic and atraumatic.  Eyes:  Conjunctivae and EOM are normal. Pupils are equal, round, and reactive to light. No scleral icterus.  Neck: Normal range of motion. Neck supple. No JVD present. No thyromegaly present.  Cardiovascular: Normal rate, regular rhythm, normal heart sounds and intact distal pulses.  Exam reveals no gallop and no friction rub.   No murmur heard. Pulmonary/Chest: Effort normal and breath sounds normal. No respiratory distress. She has no wheezes. She has no rales.  Abdominal: Soft. Bowel sounds are normal. She exhibits no distension and no mass. There is no tenderness. There is no rebound and no guarding.  Musculoskeletal: Normal range of motion. She exhibits no edema and no tenderness.  Lymphadenopathy:    She has no cervical adenopathy.  Neurological: She is alert and oriented to person, place, and time.  Psychiatric: She has a normal mood and affect. Her behavior is normal.          Assessment & Plan:

## 2010-09-28 NOTE — Assessment & Plan Note (Signed)
Excellent control at this time with an A1c of 6.1. I would continue the patient on metformin at this point of time.

## 2010-09-28 NOTE — Assessment & Plan Note (Signed)
Blood pressure is high at 166 systolic. The patient has not been taking her blood pressure medications were lost more than one month. I would have patient restart her blood pressure medications and have her come back in one month to recheck blood pressure and labs.

## 2010-10-12 LAB — GLUCOSE, CAPILLARY: Glucose-Capillary: 113 mg/dL — ABNORMAL HIGH (ref 70–99)

## 2010-10-15 LAB — POCT URINALYSIS DIP (DEVICE)
Glucose, UA: NEGATIVE mg/dL
Hgb urine dipstick: NEGATIVE
Nitrite: NEGATIVE
Urobilinogen, UA: 0.2 mg/dL (ref 0.0–1.0)

## 2010-10-22 ENCOUNTER — Ambulatory Visit: Payer: Self-pay | Admitting: Obstetrics & Gynecology

## 2010-10-22 ENCOUNTER — Other Ambulatory Visit: Payer: Self-pay | Admitting: Obstetrics and Gynecology

## 2010-10-22 DIAGNOSIS — N870 Mild cervical dysplasia: Secondary | ICD-10-CM

## 2010-10-23 NOTE — Group Therapy Note (Signed)
Connie Powell, Connie Powell                ACCOUNT NO.:  [id number]  MEDICAL RECORD NO.:  [id number]           PATIENT TYPE:  A  LOCATION:  [facility]                   FACILITY:  [facility]  PHYSICIAN:  Argentina Donovan, MD        DATE OF BIRTH:  1940/11/30  DATE OF SERVICE:  10/22/2010                                 CLINIC NOTE  The patient is a 70 year old [place] African lady from [place] who speaks no English and here with her granddaughter who does speak [place].  She had a colposcopy done in August 2011, which showed some mild dysplasia.  She had a mammogram done in November 2011 and she is back today for repeat Pap smear.  She has no other complaints.  The examination of breasts is symmetrical, no dominant masses or nipple discharge.  No supraclavicular or axillary nodes.  The abdomen is soft, flat, and nontender.  No masses or organomegaly.  External genitalia is normal.  BUS, vagina atrophic with loss of rugation.  The cervix is fish mouth and flushed against the apex of the vagina.  Pap smear was taken with no difficulty.  The uterus could not be well outlined on bimanual nor could the ovaries.  IMPRESSION:  Previous cervical dysplasia, recent mammogram normal.          ______________________________ Argentina Donovan, MD    PR/MEDQ  D:  10/22/2010  T:  10/23/2010  Job:  [id number]

## 2010-10-29 ENCOUNTER — Ambulatory Visit (INDEPENDENT_AMBULATORY_CARE_PROVIDER_SITE_OTHER): Payer: Self-pay | Admitting: Internal Medicine

## 2010-10-29 ENCOUNTER — Encounter: Payer: Self-pay | Admitting: Internal Medicine

## 2010-10-29 DIAGNOSIS — E119 Type 2 diabetes mellitus without complications: Secondary | ICD-10-CM

## 2010-10-29 DIAGNOSIS — I10 Essential (primary) hypertension: Secondary | ICD-10-CM

## 2010-10-29 MED ORDER — SIMVASTATIN 80 MG PO TABS: 40.0000 mg | ORAL_TABLET | Freq: Every day | ORAL | Status: DC

## 2010-10-29 MED ORDER — TRIAMTERENE-HCTZ 37.5-25 MG PO CAPS: 1.0000 | ORAL_CAPSULE | Freq: Every day | ORAL | Status: DC

## 2010-10-29 MED ORDER — METFORMIN HCL 1000 MG PO TABS: 1000.0000 mg | ORAL_TABLET | Freq: Two times a day (BID) | ORAL | Status: DC

## 2010-10-29 NOTE — Assessment & Plan Note (Addendum)
Patient's blood pressure is still not controlled though she has not been taking her blood pressure medications. I would start her on HCTZ combination with triamterene today. I would check her B-met  at the followup visit as this is the only medication which resolved 4 hours list and since patient is stage II hypertension she would probably require combined pill.

## 2010-10-29 NOTE — Patient Instructions (Signed)
Please buy a blood pressure monitor and check your blood pressure at least 2-3 times a week. Please bring all her medications at her next office visit. Make a followup appointment at one month.

## 2010-10-29 NOTE — Progress Notes (Signed)
  Subjective:    Patient ID: Connie Powell, female    DOB: May 05, 1941, 70 y.o.   MRN: [id number]  HPI The patient is a 70 year old female from [place]. This is a followup appointment for blood pressure.  Patient's blood pressure is high today at 171/80. Patient has had difficulty obtaining her blood pressure medications. She says that she cannot afford her losartan anymore. Patient also had to pay about $20 for simvastatin at [facility].  Patient had a death in her family and is very depressed and has been crying since last 2 weeks. She speaks very little [place] and her daughter is doing most of the translation for her. Patient does not need any sort of formal counseling at this time. There are no suicidal or homicidal ideations. Patient lives in a joint family along with her daughter.  No other complaints at this time.  Review of Systems  Constitutional: Negative for fever, activity change and appetite change.  HENT: Negative for sore throat.   Respiratory: Negative for cough and shortness of breath.   Cardiovascular: Negative for chest pain and leg swelling.  Gastrointestinal: Negative for nausea, abdominal pain, diarrhea, constipation and abdominal distention.  Genitourinary: Negative for frequency, hematuria and difficulty urinating.  Neurological: Negative for dizziness and headaches.  Psychiatric/Behavioral: Negative for suicidal ideas and behavioral problems.       Objective:   Physical Exam  Constitutional: She is oriented to person, place, and time. She appears well-developed and well-nourished.  HENT:  Head: Normocephalic and atraumatic.  Eyes: Conjunctivae and EOM are normal. Pupils are equal, round, and reactive to light. No scleral icterus.  Neck: Normal range of motion. Neck supple. No JVD present. No thyromegaly present.  Cardiovascular: Normal rate, regular rhythm, normal heart sounds and intact distal pulses.  Exam reveals no gallop and no friction rub.   No murmur  heard. Pulmonary/Chest: Effort normal and breath sounds normal. No respiratory distress. She has no wheezes. She has no rales.  Abdominal: Soft. Bowel sounds are normal. She exhibits no distension and no mass. There is no tenderness. There is no rebound and no guarding.  Musculoskeletal: Normal range of motion. She exhibits no edema and no tenderness.  Lymphadenopathy:    She has no cervical adenopathy.  Neurological: She is alert and oriented to person, place, and time.  Psychiatric: Her behavior is normal.       Mood and affect is appropriate.          Assessment & Plan:

## 2010-10-29 NOTE — Assessment & Plan Note (Addendum)
No changes in medications today. I have advised the patient to use that as stated where she can get diabetes medications for free.

## 2010-11-12 ENCOUNTER — Telehealth: Payer: Self-pay | Admitting: *Deleted

## 2010-11-12 NOTE — Telephone Encounter (Signed)
CALLED PATIENT BY BOTH PHONE NUMBERS, LEFT VOICE MESSAGE FOR HER TO CALL ME BACK SO I CAN GET HER SCHEDULED FOR THE [facility].  Connie Powell NTII

## 2010-11-13 ENCOUNTER — Telehealth: Payer: Self-pay | Admitting: Dietician

## 2010-11-13 NOTE — Telephone Encounter (Signed)
Agree with putting the patient on [facility] list.

## 2010-11-13 NOTE — Telephone Encounter (Signed)
Family called in response to letter sent out about eye exam. Note last eye exam was confirmed to be at [facility] with Dr. Nelle Don 04-24-09. Patient has no insurance and also cataracts. Not sure with cataracts that screening with retinal camera would be appropriate.   Will route to her physician and his nurse to get her on the list for [facility] if appropriate

## 2010-11-16 NOTE — Telephone Encounter (Signed)
Discussed patient eye exam with nurses; there are no more P4[facility] appointments left this year and they plan to call [facility] to see if they will see her with cataracts

## 2010-11-17 NOTE — Discharge Summary (Signed)
Connie Powell                ACCOUNT NO.:  [id number]   MEDICAL RECORD NO.:  [id number]          PATIENT TYPE:  OBV   LOCATION:  5530                         FACILITY:  [facility]   PHYSICIAN:  Connie Charity, MD     DATE OF BIRTH:  [birth year]   DATE OF ADMISSION:  04/16/2008  DATE OF DISCHARGE:  04/18/2008                               DISCHARGE SUMMARY   DISCHARGE DIAGNOSES:  1. Syncope, most likely due to hypoglycemia versus possible      orthostatic hypotension.  2. Diabetes.  3. Hypertension.  4. Hyperlipidemia.  5. Osteoporosis.  6. Leukopenia.  7. Thrombocytopenia.  8. Status post cholecystectomy.   DISCHARGE MEDICATIONS:  1. Zocor 40 mg p.o. nightly.  2. Caltrate plus D 600-400 mg p.o. daily   The patient was asked to stop taking her glipizide as well as her  lisinopril/hydrochlorothiazide.  She was also given a prescription for  blood sugar testing strips.   DISCHARGE CONDITION AND FOLLOWUP:  The patient had no further syncopal  episodes while in the hospital.  She was discharged in stable condition.  She did have orthostatic hypotension as well as two documented  hypoglycemic events while receiving only sliding scale coverage.  Therefore, her diabetes medicine and blood pressure medicines were  discontinued.  She is to follow up with Dr. Elvera Lennox in the outpatient  clinic on May 01, 2008.  She will arrive at 2:30 p.m. to have BMET  drawn.  She will also be checking her blood sugars and her blood  pressures at home.  It is likely that she no longer requires medication  for her diabetes or her hypertension since it appears she has lost 10  pounds in the last year and does not appear to be hyperglycemic or  hypertensive at this point. If she is hypertensive or hyperglycemic,  restarting her on very low doses for these would be the safest thing.   PROCEDURES:  CT head without contrast dated April 16, 2008, showed no  acute intracranial abnormality, no acute  hemorrhage, no masses.  CT  angiography of the chest dated April 17, 2008, showed no evidence of  pulmonary embolism and no acute findings in the chest, but did reveal  cardiomegaly.  A 2D echocardiogram dated April 17, 2008, showed left  ventricular ejection fraction of 60% with no left ventricular regional  wall motion abnormalities.  There was mild to moderate pulmonic  regurgitation.   BRIEF ADMITTING HISTORY AND PHYSICAL:  Connie Powell is a 70 year old woman  with type 2 diabetes and hypertension who presented to the ED after one  episode of syncope that happened at home.  She had been feeling dizzy  while sitting and thought that she might be hypoglycemic, so she got up  to get some juice from the refrigerator.  Her family states that she did  not get any juice, but she fell and hit the back of her head and lost  consciousness for about 5 seconds.  When she awoke, she appeared  confused and complained of nausea and vomited once.  She had no  preminatory  aura.  No chest pain.  No palpitations, fever, or headache.  There were no focal deficits, seizure activities, sweating, tongue  biting, incontinence, or any other evidence of seizure noted by her  family that was present.  CBG checked by EMS was 168.   LABORATORY DATA:  Sodium 139, potassium 3.9, chloride 102, bicarb 29,  BUN 21, creatinine 0.88, glucose 113.  White blood count 5, hemoglobin  12.4, platelets 106, INR 1.0.  Chest x-ray and CT head showed the  findings described above.  Urinalysis showed ketones 15.  Point of care  cardiac markers were negative.   PHYSICAL EXAMINATION:  VITAL SIGNS:  Temperature 97, blood pressure  127/68, pulse 63, respirations 16, and oxygen saturation 100% room air.  GENERAL:  She was in no acute distress.  HEENT:  Extraocular movements were intact.  Pupils were equal, round,  and reactive to light.  LUNGS:  Clear to auscultation bilaterally.  HEART:  Regular rate and rhythm.  No murmurs,  rubs, or gallops.  ABDOMEN:  Soft, nontender, and nondistended.  She had no edema in her  extremities.  No calf swelling.  NEURO:  She was alert and oriented x3.  Cranial nerves II through XII  were intact.  She had no focal, motor, or sensory deficit.  Cerebellar  function was normal.   HOSPITAL COURSE:  Syncope.  The patient did not have any further  syncopal events while in the hospital.  She did have two episodes of  hypoglycemia while receiving only sliding scale insulin (1 unit x three  administrations) in the hospital.  She was not symptomatic with these.  She did also have hypotension with a blood pressure at 2:00 a.m. of 89  systolic, which on repeat was 98.  She had been receiving her home dose  of hydrochlorothiazide 25 and lisinopril 20.  She was ruled out for  acute myocardial infarction with negative cardiac enzymes and EKGs.  CT  angiography was obtained, which showed no evidence of pulmonary  embolism.  A 2D echocardiogram was obtained with the findings described  above.  With a negative workup for cardiac causes for her syncope and  her documented hypoglycemia and hypertension, it was decided to  discharge the patient off her blood pressure and diabetes medications.  She will check her blood pressures and sugars at home and will follow up  as outlined above.  She will call the outpatient clinic if she has blood  sugars higher than 200 or blood pressures higher than 160.   DISCHARGE LABORATORY AND VITALS:  Temperature 98.2, pulse 65,  respirations 18, blood pressure 97/58, and oxygen saturation 98% room  air.  White blood count 5.3, hemoglobin 11.8, platelets 113.  Sodium  138, potassium 4.0, chloride 102, bicarb 27, glucose 171, BUN 19,  creatinine 0.91, calcium 9.5.      Connie Dubonnet, MD  Electronically Signed      Connie Charity, MD  Electronically Signed    PN/MEDQ  D:  04/19/2008  T:  04/19/2008  Job:  [id number]

## 2010-11-17 NOTE — Group Therapy Note (Signed)
NAME:  Connie Powell, Connie Powell                ACCOUNT NO.:  [id number]   MEDICAL RECORD NO.:  [id number]          PATIENT TYPE:  WOC   LOCATION:  [facility]                   FACILITY:  [facility]   PHYSICIAN:  Allie Bossier, MD        DATE OF BIRTH:  Dec 14, 1940   DATE OF SERVICE:                                  CLINIC NOTE   The patient is a 70 year old African female presenting for her annual  exam and Pap smear, followup for an abnormal Pap smear in 2007,  dysplasia CIN I.  She is recommended to follow up in 6 months and has  not had an exam since then.  She has diabetes and hypertension, and  currently only takes glipizide, unknown dose once daily for her diabetes  for which she is seen at [facility] for management.   PHYSICAL EXAMINATION:  VITAL SIGNS:  Today, her temperature is 97.5,  pulse 67; blood pressure 182/77, rechecking 182/82; weight 114.7 pounds,  62 kg, and height 69 inches.  She has no allergies.  GENERAL:  She is a well-developed, well-appearing African female, in no  acute distress.  CARDIOVASCULAR:  Regular rate and rhythm.  No murmurs, rubs, or gallops.  LUNGS:  Clear to auscultation bilaterally.  ABDOMEN:  Soft, nontender with active bowel sounds.  GENITOURINARY:  Normal external genitalia.  Cervix without lesions,  uterine scaring status post cesarean section.  No adnexal masses  palpated.  No cervical motion tenderness.  EXTREMITIES:  Warm and well perfused.  Pulses 2+ and equal bilaterally  at dorsalis pedis.  No varicosities or ulcers.   ASSESSMENT AND PLAN:  This is a 70 year old African American female with  diabetes type 2 presenting for Pap smear and annual exam which was  performed, we will inform her of the results via letter and follow  pending the results likely in 1 year.  She will continue her diabetes  and hypertension management at [facility].   Dictated by Joni Reining __________ PA.      Allie Bossier, MD     MCD/MEDQ  D:  09/13/2008  T:  09/13/2008  Job:   [id number]

## 2010-11-20 NOTE — Op Note (Signed)
Connie Powell, Connie Powell                            ACCOUNT NO.:  [id number]   MEDICAL RECORD NO.:  [id number]                   PATIENT TYPE:  INP   LOCATION:  0352                                 FACILITY:  [facility]   PHYSICIAN:  Abigail Miyamoto, M.D.              DATE OF BIRTH:  1941/01/24   DATE OF PROCEDURE:  01/22/2003  DATE OF DISCHARGE:                                 OPERATIVE REPORT   PREOPERATIVE DIAGNOSIS:  Gallstone pancreatitis, cholecystitis.   POSTOPERATIVE DIAGNOSIS:  Gallstone pancreatitis, cholecystitis.   OPERATION/PROCEDURE:  Laparoscopic cholecystectomy with intraoperative  cholangiogram.   SURGEON:  Abigail Miyamoto, M.D.   ASSISTANT:  Velora Heckler, M.D.   ANESTHESIA:  General endotracheal anesthesia and 0.0.25% Marcaine.   ESTIMATED BLOOD LOSS:  Minimal.   INDICATIONS:  Connie Powell is a 70 year old African female who presented  with gallstone pancreatitis and cholecystitis.  She was found to have an  elevated bilirubin and an ultrasound showing her to have a mildly dilated  common bile duct.  After her improving symptoms, decision was made to  proceed with laparoscopic cholecystectomy and intraoperative cholangiogram  after gastroenterology consultation.   FINDINGS:  The patient was found to have a normal cholangiogram without  evidence of common bile duct stones or obstruction.  Contrast was seen to  flow easily into the duodenum.   DESCRIPTION OF PROCEDURE:  The patient was brought to the operating room and  identified as Connie Powell.  She was placed supine on the operating room  table and general anesthesia was induced.  Her abdomen was then prepped and  draped in the usual sterile fashion.  Using #15 blade, a small vertical  incision was made above the umbilicus.  The incision was carried down  through the fascia which was then opened with a scalpel.  Hemostat was then  used to pass through the peritoneal cavity.  Next, a 0 Vicryl pursestring  was placed around the fascial opening.  The Hasson port was placed through  the opening and insufflation of the abdomen was begun.  Next, a 12 mm port  was placed in the patient's epigastrium and two 5 mm ports were placed in  the patient's right flank under direct vision.  The patient was found to  have a small amount of fluid in the right upper quadrant and distended  gallbladder which was acutely inflamed.  The gallbladder was retracted above  the liver bed.  Dissection was then carried out at the base of the  gallbladder.  The cystic artery was found to be anterior.  It was clipped  twice proximally and once distally and transected with scissors.  Dissection  then revealed the cystic duct which was then clipped once distally and  partly transected with scissors.  An angiocatheter  was then inserted under  direct vision in the right upper quadrant.  The cholangiocath was passed  through the angiocatheter  and placed into the cystic duct.  A cholangiogram  was then performed under direct fluoroscopy.  A good flow of contrast was  seen into the entire biliary system without evidence of obstructing stone or  abnormality.  The cholangiocatheter was then removed.  The cystic duct was  then clipped three times proximally and transected with the scissors.  The  gallbladder was then easily dissected free from the liver bed with the  electrocautery.  The gallbladder wall was found to be thickened and  edematous.  Hemostasis was achieved in the lower bed with the  electrocautery.  Once the gallbladder was free from the liver bed, it was  pulled out through the incision at the umbilicus.  The 0 Vicryl was tied in  place, closing the fascial defect at the umbilicus.  The abdomen was then  irrigated with normal saline.  Again hemostasis appeared to be achieved.  All ports were removed under direct vision and the abdomen was deflated.  All incisions were then anesthetized with 0.25% Marcaine and then  closed  with 4-0 Monocryl subcuticular sutures.  Steri-Strips, gauze and tape were  then applied.  The patient tolerated the procedure well.  All sponge, needle  and instrument counts were correct at the end of the procedure.  The patient  was then extubated in the operating room and taken in stable condition to  the recovery room.                                               Abigail Miyamoto, M.D.    DB/MEDQ  D:  01/22/2003  T:  01/22/2003  Job:  [id number]

## 2010-11-20 NOTE — Discharge Summary (Signed)
Connie Powell, Connie Powell                            ACCOUNT NO.:  [id number]   MEDICAL RECORD NO.:  [id number]                   PATIENT TYPE:  INP   LOCATION:  0352                                 FACILITY[facility]   PHYSICIAN:  Abigail Miyamoto, M.D.              DATE OF BIRTH:  18-Mar-1941   DATE OF ADMISSION:  01/19/2003  DATE OF DISCHARGE:  01/23/2003                                 DISCHARGE SUMMARY   DISCHARGE DIAGNOSES:  1. Gallstone pancreatitis.  2. Symptomatic cholelithiasis.  3. Cholecystitis.  4. Status post laparoscopic cholecystectomy and intraoperative     cholangiogram.   SUMMARY OF HISTORY:  Connie Powell is a 70 year old African female who  presented with abdominal pain, nausea and vomiting.  She apparently has had  abdominal pain in the past, but this had gotten worse over several days and  she also developed nausea and vomiting.  On examination she had marked  amount of tenderness in the epigastrium and right upper quadrant, at the  time of admission she was found to have a lipase of 2025, bilirubin elevated  at 2, and alkaline phosphatase normal at 79.  Her white blood count was  normal at 8.6.  She had a CAT scan of her abdomen and pelvis without  contrast showing her to have pericholecystic fluid collections and  pancreatic fluid collections consistent with cholecystitis and pancreatitis.   HOSPITAL COURSE:  Given the findings of pancreatitis and ill appearance of  Connie Powell, she was admitted to the hospital and into the intensive care  unit.  She was placed on IV antibiotics.  A Foley catheter was also inserted  to watch her urine output.  She was also placed on IV antibiotics.  On the  second hospital day her abdominal tenderness was slightly less, her lipase  decreased to 210 and her amylase decreased to 442, bilirubin elevated at  _________, white count remained normal.  Secondary to increasing bilirubin,  an ultrasound was ordered and a gastroenterology  consultation was obtained  from Dr. Leone Payor and Dr. Yancey Flemings for consideration for preoperative ERCP.  Over the next several days the patient's bilirubin continued to increase and  she did have fevers, but from a clinical standpoint and abdominal standpoint  her tenderness greatly improved as well as her pain.  By January 21, 2003 she  was still having fevers, bilirubin was 3.2 at this point, her ultrasound  showed a prominent common bile duct and pancreatic duct.  Gastroenterology  again evaluated her and felt that she may have already passed her stone and  the decision was thus made to proceed to the operating room for  cholecystectomy.  The patient was taken to the operating room on January 22, 2003, where she underwent a laparoscopic cholecystectomy with intraoperative  cholangiogram and was found to have no common bile duct stones.  She  tolerated the procedure well and was  taken back to the regular surgical  floor.  Her diet was quickly advanced on postoperative day #1, she was doing  very well, her family was present, and requests her discharge to home.  She  was tolerating oral pain medications and a regular diet and at this point a  decision was made to discharge the patient to home.  Again discharge  diagnosis is as above.   DISCHARGE MEDICATIONS:  She will resume her home medications and take  Percocet and ibuprofen for pain.   DISCHARGE ACTIVITIES:  She will do no heavy lifting for approximately five  week.    WOUND CARE:  She may remove her bandage and shower.   FOLLOWUP:  She will follow up in my office in two weeks post-discharge.                                               Abigail Miyamoto, M.D.    DB/MEDQ  D:  01/31/2003  T:  01/31/2003  Job:  [id number]

## 2010-11-20 NOTE — H&P (Signed)
NAMESusann Powell                            ACCOUNT NO.:  [id number]   MEDICAL RECORD NO.:  [id number]                   PATIENT TYPE:  EMS   LOCATION:  ED                                   FACILITY:  [facility]   PHYSICIAN:  Abigail Miyamoto, M.D.              DATE OF BIRTH:  15-Jul-1940   DATE OF ADMISSION:  01/19/2003  DATE OF DISCHARGE:                                HISTORY & PHYSICAL   CHIEF COMPLAINT:  Abdominal pain.   HISTORY OF PRESENT ILLNESS:  This is a 70 year old African female who  presents with abdominal pain, nausea, and vomiting.  At the time of my  examination, she was heavily sedated and was barely arousable, but family  was present.  The family, however, are poor historians regarding her  previous medical problems.  They report she has been having on and off  abdominal pain for years, but this time it got much worse.  She developed  nausea and vomiting and then presented here for further evaluation.  They  report she has no other medical problems, has had no chest or lung problems.   PAST MEDICAL HISTORY:  Apparently is negative for hypertension and diabetes.   PAST SURGICAL HISTORY:  Unknown, although she has a lower midline incision.  She may of had a hysterectomy.   MEDICATIONS:  None.   ALLERGIES:  None.   SOCIAL HISTORY:  She does not smoke or drink alcohol.  She has recently  moved to the [place] and does not speak [place].   REVIEW OF SYSTEMS:  Otherwise unobtainable secondary to the patient's  sedation.   PHYSICAL EXAMINATION:  GENERAL:  She is sedated, in no acute distress.  VITAL SIGNS:  Blood pressure is 121/79, heart rate is 97, respiratory rate  is 20.  HEENT:  Eyes:  Pupils are reactive.  Sclerae are icteric.  NECK:  Supple.  There is no cervical adenopathy.  There is no thyromegaly.  CARDIOVASCULAR:  Tachycardic with regular rhythm and no murmurs.  LUNGS:  Clear to auscultation bilaterally.  Effort appears normal.  ABDOMEN:  Soft  with marked tenderness in the epigastric and right upper  quadrant with positive guarding.  There are no masses.  There is no  appreciable organomegaly.  She has a well-healed lower midline incision with  a questionable hernia at the umbilicus.  EXTREMITIES:  Cool.   LABORATORY DATA:  The patient has a lipase of 2025, bilirubin elevated at 2,  alkaline phosphatase 79, AST 178, ALT 81.  Potassium is low at 3.3,  creatinine is 1.  White blood cell count is 8.6, hemoglobin is 15.1,  platelets are 137.  The patient had a CAT scan of the abdomen and pelvis  without contrast that shows the patient to have pericolic cystic fluid and  peripancreatic fluid consistent with pancreatitis and cholecystitis.  There  is no free air.  There is no  evidence of necrosis, although this is a non-  contrasted CAT scan.   ASSESSMENT AND PLAN:  This is a 70 year old female with significant  gallstone pancreatitis and cholecystitis.  The plan at this point is to  admit the patient to the intensive care unit for IV antibiotics, bowel rest,  and IV rehydration.  If her bilirubin continues to go up, we will need to  get a gastroenterology consult for possible endoscopic retrograde  cholangiopancreatography.  Once her pancreatitis improves, we will hopefully  proceed with cholecystectomy.  If the patient continues to worsen, we may  need to proceed emergently to the operating room first for a percutaneous  cholecystostomy drain.                                               Abigail Miyamoto, M.D.    DB/MEDQ  D:  01/19/2003  T:  01/19/2003  Job:  [id number]

## 2010-11-26 ENCOUNTER — Ambulatory Visit (INDEPENDENT_AMBULATORY_CARE_PROVIDER_SITE_OTHER): Payer: Self-pay | Admitting: Internal Medicine

## 2010-11-26 DIAGNOSIS — E119 Type 2 diabetes mellitus without complications: Secondary | ICD-10-CM

## 2010-11-26 DIAGNOSIS — Z598 Other problems related to housing and economic circumstances: Secondary | ICD-10-CM

## 2010-11-26 DIAGNOSIS — E785 Hyperlipidemia, unspecified: Secondary | ICD-10-CM

## 2010-11-26 DIAGNOSIS — I10 Essential (primary) hypertension: Secondary | ICD-10-CM

## 2010-11-26 LAB — BASIC METABOLIC PANEL
Chloride: 100 mEq/L (ref 96–112)
Potassium: 4.2 mEq/L (ref 3.5–5.3)
Sodium: 138 mEq/L (ref 135–145)

## 2010-11-26 LAB — MAGNESIUM: Magnesium: 1.7 mg/dL (ref 1.5–2.5)

## 2010-11-26 NOTE — Patient Instructions (Signed)
Sodium-Controlled Diet Sodium is a mineral. It is found in many foods. Sodium may be found naturally or added during the making of a food. The most common form of sodium is salt, which is made up of sodium and chloride. Reducing your sodium intake involves changing your eating habits. The following guidelines will help you reduce the sodium in your diet:  Stop using the salt shaker.   Use salt sparingly in cooking and baking.   Substitute with sodium-free seasonings and spices.   Do not use a salt substitute (potassium chloride) without your caregiver's permission.   Include a variety of fresh, unprocessed foods in your diet.   Limit the use of processed and convenience foods that are high in sodium.  USE THE FOLLOWING FOODS SPARINGLY: Breads/Starches  Commercial bread stuffing, commercial pancake or waffle mixes, coating mixes. Waffles. Croutons. Prepared (boxed or frozen) potato, rice, or noodle mixes that contain salt or sodium. Salted [place] fries or hash browns. Salted popcorn, breads, crackers, chips, or snack foods.  Vegetables  Vegetables canned with salt or prepared in cream, butter, or cheese sauces. Sauerkraut. Tomato or vegetable juices canned with salt.   Fresh vegetables are allowed if rinsed thoroughly.  Fruit  Fruit is okay to eat.  Meat and Meat Substitutes  Salted or smoked meats, such as bacon or Canadian bacon, chipped or corned beef, hot dogs, salt pork, luncheon meats, pastrami, ham, or sausage. Canned or smoked fish, poultry, or meat. Processed cheese or cheese spreads, blue or Roquefort cheese. Battered or frozen fish products. Prepared spaghetti sauce. Baked beans. Reuben sandwiches. Salted nuts. Caviar.  Milk  Limit buttermilk to 1 cup per week.  Soups and Combination Foods  Bouillon cubes, canned or dried soups, broth, consomm. Convenience (frozen or packaged) dinners with more than 600 milligrams (mg) sodium. Pot pies, pizza, Asian food, fast food  cheeseburgers, and specialty sandwiches.  Desserts and Sweets  Regular (salted) desserts, pie, commercial fruit snack pies, commercial snack cakes, canned puddings.   Eat desserts and sweets in moderation.  Fats and Oils  Gravy mixes or canned gravy. No more than 1 to 2 tablespoons of salad dressing. Chip dips.   Eat fats and oils in moderation.  Beverages  See those listed under the vegetables and milk groups.  Condiments/Miscellaneous  Ketchup, mustard, meat sauces, salsa, regular (salted) and lite soy sauce or mustard. Dill pickles, olives, meat tenderizer. Prepared horseradish or pickle relish. Dutch-processed cocoa. Baking powder or baking soda used medicinally. Worcestershire sauce. "Light" salt. Salt substitute, unless approved by your caregiver.  Document Released: 12/11/2001 Document Re-Released: 09/15/2009 [facility] Patient Information 2011 [place], [place].  1. Stop in the lab to have your blood drawn.  If we need to follow up on the results I will call you.  2. Continue taking all your medications as prescribed.  3. Follow up in the end of July or first part of August for your cholesterol check.  If you can make a morning appointment to come in before you eat that would be best.  4. Stop and talk with Chauncey Reading about trying to become eligible for Medicare.  5. Any other questions please call the clinic at [phone]

## 2010-11-26 NOTE — Progress Notes (Signed)
  Subjective:    Patient ID: Connie Powell, female    DOB: 07/20/1940, 70 y.o.   MRN: [id number]  HPI  Connie Powell is a 70 year old woman who presents to clinic today for a medication recheck.  At her last visit Dr. Eben Burow he had started her on Triamterene/HCTZ combination pill for her blood pressure since she is unable to afford her ARB.  She has no insurance at this time.  She has been doing well since then.  She does state that at night sometimes she gets a feeling like her legs need to move.  When that happens she gets up and moves and they feel better.  She states that this is not new since the medication change and is not worse since starting the medications.  She is in need a Bmet since the change in her diuretics.    Review of Systems    Constitutional: Denies fever, chills, diaphoresis, appetite change and fatigue.  HEENT: Denies photophobia, eye pain, redness, hearing loss, ear pain, congestion, sore throat, rhinorrhea, sneezing, mouth sores, trouble swallowing, neck pain, neck stiffness and tinnitus.   Respiratory: Denies SOB, DOE, cough, chest tightness,  and wheezing.   Cardiovascular: Denies chest pain, palpitations and leg swelling.  Gastrointestinal: Denies nausea, vomiting, abdominal pain, diarrhea, constipation, blood in stool and abdominal distention.  Genitourinary: Denies dysuria, urgency, frequency, hematuria, flank pain and difficulty urinating.  Musculoskeletal: Denies myalgias, back pain, joint swelling, arthralgias and gait problem.  Skin: Denies pallor, rash and wound.  Neurological: Denies dizziness, seizures, syncope, weakness, light-headedness, numbness and headaches.  Hematological: Denies adenopathy. Easy bruising, personal or family bleeding history  Psychiatric/Behavioral: Denies suicidal ideation, mood changes, confusion, nervousness, sleep disturbance and agitation   Objective:   Physical Exam    Constitutional: Vital signs reviewed.  Patient is a well-developed  and well-nourished woman in no acute distress and cooperative with exam. Alert and oriented x3.  Head: Normocephalic and atraumatic Ear: TM normal bilaterally Mouth: no erythema or exudates, MMM Eyes: PERRL, EOMI, conjunctivae normal, No scleral icterus.  Neck: Supple, Trachea midline normal ROM, No JVD, mass, thyromegaly, or carotid bruit present.  Cardiovascular: RRR, S1 normal, S2 normal, no MRG, pulses symmetric and intact bilaterally Pulmonary/Chest: CTAB, no wheezes, rales, or rhonchi Abdominal: Soft. Non-tender, non-distended, bowel sounds are normal, no masses, organomegaly, or guarding present.  GU: no CVA tenderness Musculoskeletal: No joint deformities, erythema, or stiffness, ROM full and no nontender Hematology: no cervical, inginal, or axillary adenopathy.  Neurological: A&O x3, Strength is normal and symmetric bilaterally, cranial nerve II-XII are grossly intact, no focal motor deficit, sensory intact to light touch bilaterally.  Skin: Warm, dry and intact. No rash, cyanosis, or clubbing.  Psychiatric: Normal mood and affect. speech and behavior is normal. Judgment and thought content normal. Cognition and memory are normal.       Assessment & Plan:

## 2010-12-04 DIAGNOSIS — Z598 Other problems related to housing and economic circumstances: Secondary | ICD-10-CM | POA: Insufficient documentation

## 2010-12-04 NOTE — Assessment & Plan Note (Signed)
Lab Results  Component Value Date   CHOL 170 01/19/2010   CHOL 133 07/25/2008   CHOL 187 02/29/2008   Lab Results  Component Value Date   HDL 38* 01/19/2010   HDL 50 07/25/2008   HDL 45 1/61/0960   Lab Results  Component Value Date   LDLCALC 118* 01/19/2010   LDLCALC 77 07/25/2008   LDLCALC 127* 02/29/2008   Lab Results  Component Value Date   TRIG 69 01/19/2010   TRIG 31 07/25/2008   TRIG 75 02/29/2008   Lab Results  Component Value Date   CHOLHDL 4.5 Ratio 01/19/2010   CHOLHDL 2.7 Ratio 07/25/2008   CHOLHDL 4.2 Ratio 02/29/2008   No results found for this basename: LDLDIRECT   She was not fasting today and is due for lab tests for her diabetes in June.  We will have her follow up then for a fasting labs and diabetes labs.  Her last cholesterol check was above her goal of <100 but not by much.  We will continue her medication for now and follow up.

## 2010-12-04 NOTE — Assessment & Plan Note (Signed)
We also discussed trying to see what needs to be done to get her signed up for Medicare.  If she is eligible for Medicare that would be the best choice but if she is unable to get it we will try to get her set up with the [facility] card to help her afford her visits and her medications.

## 2010-12-04 NOTE — Assessment & Plan Note (Signed)
BP Readings from Last 3 Encounters:  11/26/10 148/79  10/29/10 171/80  09/28/10 166/76   Blood pressure is better today but still above her goal.  As a diabetic her goal is <130/80.  She would benefit from an ARB but is unable to afford it.  She could also try a CCB but there are none on the 4 dollar list.  Since she isn't that high above her goal we will try diet and exercise.  She was given information on a low salt diet and we discussed exercising as much as she can to try to control all of her comorbid conditions.

## 2010-12-04 NOTE — Assessment & Plan Note (Signed)
Lab Results  Component Value Date   HGBA1C 6.1 09/28/2010   HGBA1C 6.6 03/30/2010   HGBA1C 6.6 01/09/2010   Lab Results  Component Value Date   MICROALBUR 0.68 01/19/2010   LDLCALC 118* 01/19/2010   CREATININE 1.08 11/26/2010   Her diabetes is well controlled just on Metformin so we will continue for now.  She is due for a recheck in June.

## 2010-12-17 ENCOUNTER — Encounter: Payer: Self-pay | Admitting: Obstetrics & Gynecology

## 2011-02-01 ENCOUNTER — Ambulatory Visit (INDEPENDENT_AMBULATORY_CARE_PROVIDER_SITE_OTHER): Payer: Self-pay | Admitting: Internal Medicine

## 2011-02-01 ENCOUNTER — Encounter: Payer: Self-pay | Admitting: Internal Medicine

## 2011-02-01 VITALS — BP 163/72 | HR 63 | Temp 97.4°F | Wt 114.6 lb

## 2011-02-01 DIAGNOSIS — I10 Essential (primary) hypertension: Secondary | ICD-10-CM

## 2011-02-01 DIAGNOSIS — E785 Hyperlipidemia, unspecified: Secondary | ICD-10-CM

## 2011-02-01 DIAGNOSIS — E119 Type 2 diabetes mellitus without complications: Secondary | ICD-10-CM

## 2011-02-01 LAB — POCT GLYCOSYLATED HEMOGLOBIN (HGB A1C): Hemoglobin A1C: 6.5

## 2011-02-01 MED ORDER — TRIAMTERENE-HCTZ 37.5-25 MG PO CAPS: 1.0000 | ORAL_CAPSULE | Freq: Every day | ORAL | Status: DC

## 2011-02-01 MED ORDER — METFORMIN HCL 1000 MG PO TABS: 500.0000 mg | ORAL_TABLET | Freq: Two times a day (BID) | ORAL | Status: DC

## 2011-02-01 NOTE — Progress Notes (Signed)
  Subjective:    Patient ID: Connie Powell, female    DOB: August 26, 1940, 70 y.o.   MRN: [id number]  HPI  [name] is a 70 year old woman with past medical history of diabetes, hypertension hyperlipidemia and bilateral cataracts. Patient comes in today for regular followup visit and blood pressure check.  The patient cut down on her metformin from thousand milligrams twice a day to 500 mg twice a day as she was losing weight and this was advised by one of her relatives.  Blood pressure is 163/72 today. Patient has not been taking her blood pressure medications.   Hyperlipidemia patient is compliant with simvastatin.  Patient is due for Zostavax. Patient is due for colonoscopy but cannot afford it. No other complaints at this time.  The patient has bilateral cataract and is awaiting to see an eye doctor. Patient is on a waiting list right now.  Review of Systems  Constitutional: Negative for fever, activity change and appetite change.  HENT: Negative for sore throat.   Eyes: Positive for visual disturbance.       Patient has progressively decreased vision in both eyes.  Respiratory: Negative for cough and shortness of breath.   Cardiovascular: Negative for chest pain and leg swelling.  Gastrointestinal: Negative for nausea, abdominal pain, diarrhea, constipation and abdominal distention.  Genitourinary: Negative for frequency, hematuria and difficulty urinating.  Neurological: Negative for dizziness and headaches.  Psychiatric/Behavioral: Negative for suicidal ideas and behavioral problems.       Objective:   Physical Exam  Constitutional: She is oriented to person, place, and time. She appears well-developed and well-nourished.  HENT:  Head: Normocephalic and atraumatic.  Eyes: Conjunctivae and EOM are normal. Pupils are equal, round, and reactive to light. No scleral icterus.       Nuclear cataracts noted to on both eyes.  Neck: Normal range of motion. Neck supple. No JVD present.  No thyromegaly present.  Cardiovascular: Normal rate, regular rhythm, normal heart sounds and intact distal pulses.  Exam reveals no gallop and no friction rub.   No murmur heard. Pulmonary/Chest: Effort normal and breath sounds normal. No respiratory distress. She has no wheezes. She has no rales.  Abdominal: Soft. Bowel sounds are normal. She exhibits no distension and no mass. There is no tenderness. There is no rebound and no guarding.  Musculoskeletal: Normal range of motion. She exhibits no edema and no tenderness.  Lymphadenopathy:    She has no cervical adenopathy.  Neurological: She is alert and oriented to person, place, and time.  Psychiatric: She has a normal mood and affect. Her behavior is normal.          Assessment & Plan:   No problem-specific assessment & plan notes found for this encounter.

## 2011-02-01 NOTE — Patient Instructions (Signed)
2 Gram Low Sodium Diet  A 2 gram sodium diet restricts the amount of sodium in the diet to no more than 2 grams (g) or 2000 milligrams (mg) daily. Limiting the amount of sodium is often used to help lower blood pressure. It is important if you have heart, liver, or kidney problems. Many foods contain sodium for flavor and sometimes as a preservative. When the amount of sodium in a diet needs to be low, it is important to know what to look for when choosing foods and drinks. The following includes some information and guidelines to help make it easier for you to adapt to a low sodium diet.  QUICK TIPS   Do not add salt to food.    Avoid convenience items and fast food.    Choose unsalted snack foods.    Buy lower sodium products, often labeled as "lower sodium" or "no salt added."    Check food labels to learn how much sodium is in 1 serving.    When eating at a restaurant, ask that your food be prepared with less salt or none, if possible.   READING FOOD LABELS FOR SODIUM INFORMATION  The nutrition facts label is a good place to find how much sodium is in foods. Look for products with no more than 500 to 600 mg of sodium per meal and no more than 150 mg per serving.  Remember that 2 g = 2000 mg.  The food label may also list foods as:   Sodium-free: Less than 5 mg in a serving.    Very low sodium: 35 mg or less in a serving.    Low-sodium: 140 mg or less in a serving.    Light in sodium: 50% less sodium in a serving. For example, if a food that usually has 300 mg of sodium is changed to become light in sodium, it will have 150 mg of sodium.    Reduced sodium: 25% less sodium in a serving. For example, if a food that usually has 400 mg of sodium is changed to reduced sodium, it will have 300 mg of sodium.   CHOOSING FOODS  AVOID  CHOOSE    Grains:  Salted crackers and snack items.  Some cereals, including instant hot cereals.  Bread stuffing and biscuit mixes.  Seasoned rice or pasta mixes.  Grains:   Unsalted snack items.  Low-sodium cereals, oats, puffed wheat and rice, shredded wheat.  English muffins and bread.  Pasta.    Meats:  Salted, canned, smoked, spiced, or pickled meats, including fish and poultry.  Bacon, ham, sausage, cold cuts, hot dogs, anchovies.  Meats:  Low-sodium canned tuna and salmon.  Fresh or frozen meat, poultry, and fish.    Dairy:  Processed cheese and spreads.  Cottage cheese.  Buttermilk and condensed milk.  Regular cheese.  Dairy:  Milk.  Low-sodium cottage cheese.  Yogurt.  Sour cream.  Low-sodium cheese.    Fruits and Vegetables:  Regular canned vegetables.  Regular canned tomato sauce and paste.  Frozen vegetables in sauces.  Olives.  Pickles.  Relishes.  Sauerkraut.  Fruits and Vegetables:  Low-sodium canned vegetables.  Low-sodium tomato sauce and paste.  Fresh and frozen vegetables.  Fresh and frozen fruit.    Condiments:  Canned and packaged gravies.  Worcestershire sauce.  Tartar sauce.  Barbecue sauce.  Soy sauce.  Steak sauce.  Ketchup.  Onion, garlic, and table salt.  Meat flavorings and tenderizers.  Condiments:  Fresh and dried herbs   and spices.  Low-sodium varieties of mustard and ketchup.  Lemon juice.  Tabasco sauce.  Horseradish.    SAMPLE 2 GRAM SODIUM MEAL PLAN  Meal  Foods  Sodium (mg)    Breakfast  1 cup low-fat milk  2 slices whole-wheat toast  1 tablespoon heart-healthy margarine  1 hard-boiled egg  1 small orange  143  270  153  139  0    Lunch  1 cup raw carrots   cup hummus  1 cup low-fat milk   cup red grapes  1 whole-wheat pita bread  76  298  143  2  356    Dinner  1 cup whole-wheat pasta  1 cup low-sodium tomato sauce  3 ounces lean ground beef  1 small side salad (1 cup raw spinach leaves,  cup cucumber,  cup yellow bell pepper) with 1 teaspoon olive oil and 1 teaspoon red wine vinegar  2  73  57  25    Snack  1 container low-fat vanilla yogurt  3 graham cracker squares  107  127    Nutrient Analysis  Calories: 2033  Protein: 77 g   Carbohydrate: 282 g  Fat: 72 g  Sodium: 1971 mg  Document Released: 06/21/2005 Document Re-Released: 12/09/2009  [employer] Patient Information 2011 [employer].

## 2011-02-01 NOTE — Assessment & Plan Note (Signed)
I would continue patient on 40 mg of simvastatin daily. There is no need to check a lipid profile at this time as it would not change management much and patient is worried about the cost of the test.

## 2011-02-01 NOTE — Assessment & Plan Note (Signed)
I would go down on metformin to 500 mg twice a day given that her last 3 readings which appear and she has been less than 7.

## 2011-02-01 NOTE — Assessment & Plan Note (Signed)
Patient had not been taking her blood pressure medications. I advised them to restart her pressure medications and follow up in one month.

## 2011-03-03 ENCOUNTER — Ambulatory Visit (INDEPENDENT_AMBULATORY_CARE_PROVIDER_SITE_OTHER): Payer: Self-pay | Admitting: Internal Medicine

## 2011-03-03 ENCOUNTER — Ambulatory Visit: Payer: Self-pay | Admitting: Internal Medicine

## 2011-03-03 ENCOUNTER — Encounter: Payer: Self-pay | Admitting: Internal Medicine

## 2011-03-03 VITALS — BP 142/73 | HR 74 | Temp 97.9°F | Ht 61.0 in | Wt 111.3 lb

## 2011-03-03 DIAGNOSIS — E119 Type 2 diabetes mellitus without complications: Secondary | ICD-10-CM

## 2011-03-03 DIAGNOSIS — I10 Essential (primary) hypertension: Secondary | ICD-10-CM

## 2011-03-03 DIAGNOSIS — E785 Hyperlipidemia, unspecified: Secondary | ICD-10-CM

## 2011-03-03 LAB — GLUCOSE, CAPILLARY: Glucose-Capillary: 221 mg/dL — ABNORMAL HIGH (ref 70–99)

## 2011-03-03 MED ORDER — GLUCOSE BLOOD VI STRP: ORAL_STRIP | Status: AC

## 2011-03-03 MED ORDER — TRIAMTERENE-HCTZ 37.5-25 MG PO CAPS: 1.0000 | ORAL_CAPSULE | Freq: Every day | ORAL | Status: DC

## 2011-03-03 MED ORDER — SIMVASTATIN 80 MG PO TABS: 40.0000 mg | ORAL_TABLET | Freq: Every day | ORAL | Status: DC

## 2011-03-03 MED ORDER — METFORMIN HCL 1000 MG PO TABS: 500.0000 mg | ORAL_TABLET | Freq: Two times a day (BID) | ORAL | Status: DC

## 2011-03-03 NOTE — Assessment & Plan Note (Signed)
Lab Results  Component Value Date   HGBA1C 6.5 02/01/2011   HGBA1C 6.6 03/30/2010   CREATININE 1.08 11/26/2010   CREATININE 0.93 03/30/2010   MICROALBUR 0.68 01/19/2010   MICRALBCREAT 3.3 01/19/2010   CHOL 170 01/19/2010   HDL 38* 01/19/2010   TRIG 69 01/19/2010    Last eye exam and foot exam:    Component Value Date/Time   HMDIABEYEEXA no diabetic retinopathy, cataract 04/11/2008   HMDIABFOOTEX done 01/19/2010    Assessment: Diabetes control: controlled Progress toward goals: at goal Barriers to meeting goals: no barriers identified  Plan: Diabetes treatment: continue current medications. She did not get her meter today but looking at previous A1C - would not make any changes. Refer to: none Instruction/counseling given: reminded to bring blood glucose meter & log to each visit, reminded to bring medications to each visit, discussed foot care, discussed the need for weight loss and discussed diet

## 2011-03-03 NOTE — Patient Instructions (Addendum)
Please schedule a follow up appointment in 3-6 months. Please take your medicines as prescribed. Your prescriptions have been sent electronically to the [facility], [address].

## 2011-03-03 NOTE — Assessment & Plan Note (Signed)
Lab Results  Component Value Date   NA 138 11/26/2010   K 4.2 11/26/2010   CL 100 11/26/2010   CO2 22 11/26/2010   BUN 21 11/26/2010   CREATININE 1.08 11/26/2010   CREATININE 0.93 03/30/2010    BP Readings from Last 3 Encounters:  03/03/11 142/73  02/01/11 163/72  11/26/10 148/79    Assessment: Hypertension control:  mildly elevated  Progress toward goals:  improved Barriers to meeting goals:  no barriers identified  Plan: Hypertension treatment:  continue current medications

## 2011-03-03 NOTE — Progress Notes (Signed)
  Subjective:    Patient ID: Connie Powell, female    DOB: 19-Sep-1940, 70 y.o.   MRN: [id number]  HPI: 70 year old woman with past medical history significant for type 2 diabetes mellitus, hypertension, hyperlipidemia comes to the clinic for a followup visit.  She denies any complaints today including chest pain, shortness of breath, bladder or bowel complaints. She is just here for a regular checkup    Review of Systems  Constitutional: Negative for chills and appetite change.  HENT: Negative for ear pain, congestion, rhinorrhea, sneezing and postnasal drip.   Eyes: Negative for visual disturbance.  Respiratory: Negative for apnea, cough, choking, chest tightness, shortness of breath, wheezing and stridor.   Cardiovascular: Negative for chest pain, palpitations and leg swelling.  Gastrointestinal: Negative for nausea, abdominal pain, diarrhea and blood in stool.  Genitourinary: Negative for dysuria.  Musculoskeletal: Negative for arthralgias.  Neurological: Negative for dizziness and facial asymmetry.  Hematological: Negative for adenopathy.       Objective:   Physical Exam  Constitutional: She is oriented to person, place, and time. She appears well-developed and well-nourished. No distress.  HENT:  Head: Normocephalic and atraumatic.  Mouth/Throat: No oropharyngeal exudate.  Eyes: Conjunctivae and EOM are normal. Pupils are equal, round, and reactive to light.  Neck: Normal range of motion. Neck supple. No JVD present. No tracheal deviation present. No thyromegaly present.  Cardiovascular: Normal rate, regular rhythm, normal heart sounds and intact distal pulses.  Exam reveals no gallop and no friction rub.   No murmur heard. Pulmonary/Chest: Effort normal and breath sounds normal. No stridor. No respiratory distress. She has no wheezes. She has no rales. She exhibits no tenderness.  Abdominal: Soft. Bowel sounds are normal. She exhibits no distension and no mass. There is no  tenderness. There is no rebound and no guarding.  Musculoskeletal: Normal range of motion. She exhibits no edema and no tenderness.  Lymphadenopathy:    She has no cervical adenopathy.  Neurological: She is alert and oriented to person, place, and time. She has normal reflexes. She displays normal reflexes. No cranial nerve deficit. Coordination normal.  Skin: Skin is warm. She is not diaphoretic.          Assessment & Plan:

## 2011-03-03 NOTE — Assessment & Plan Note (Signed)
Check Lipid panel and CMP

## 2011-03-04 LAB — COMPLETE METABOLIC PANEL WITH GFR
ALT: 13 U/L (ref 0–35)
AST: 17 U/L (ref 0–37)
Albumin: 4.2 g/dL (ref 3.5–5.2)
Calcium: 9.7 mg/dL (ref 8.4–10.5)
Chloride: 99 mEq/L (ref 96–112)
Potassium: 4.1 mEq/L (ref 3.5–5.3)
Sodium: 137 mEq/L (ref 135–145)
Total Protein: 7 g/dL (ref 6.0–8.3)

## 2011-03-04 LAB — LIPID PANEL
Cholesterol: 144 mg/dL (ref 0–200)
HDL: 44 mg/dL (ref >39)
Triglycerides: 75 mg/dL (ref <150)

## 2011-04-05 LAB — URINALYSIS, ROUTINE W REFLEX MICROSCOPIC
Glucose, UA: NEGATIVE
Hgb urine dipstick: NEGATIVE
Ketones, ur: 15 — AB
Protein, ur: NEGATIVE

## 2011-04-05 LAB — DIFFERENTIAL
Basophils Absolute: 0
Basophils Relative: 0
Eosinophils Absolute: 0
Monocytes Absolute: 0.2
Neutro Abs: 3.9
Neutrophils Relative %: 79 — ABNORMAL HIGH

## 2011-04-05 LAB — LIPID PANEL
Cholesterol: 144
HDL: 41
LDL Cholesterol: 98
Total CHOL/HDL Ratio: 3.5
Triglycerides: 25
VLDL: 5

## 2011-04-05 LAB — HEPATIC FUNCTION PANEL
AST: 25
Alkaline Phosphatase: 43
Bilirubin, Direct: 0.2
Total Bilirubin: 1.3 — ABNORMAL HIGH

## 2011-04-05 LAB — CBC
Hemoglobin: 11.8 — ABNORMAL LOW
MCHC: 34.3
MCHC: 34.6
MCV: 93.3
RBC: 3.63 — ABNORMAL LOW
RDW: 12.9
WBC: 5.3

## 2011-04-05 LAB — CARDIAC PANEL(CRET KIN+CKTOT+MB+TROPI)
CK, MB: 3.2
Relative Index: 1.6
Relative Index: 2.1
Total CK: 151

## 2011-04-05 LAB — GLUCOSE, CAPILLARY
Glucose-Capillary: 132 — ABNORMAL HIGH
Glucose-Capillary: 139 — ABNORMAL HIGH
Glucose-Capillary: 220 — ABNORMAL HIGH
Glucose-Capillary: 47 — ABNORMAL LOW

## 2011-04-05 LAB — BASIC METABOLIC PANEL
CO2: 27
CO2: 29
Calcium: 9.5
Calcium: 9.5
Chloride: 102
Creatinine, Ser: 0.88
Glucose, Bld: 113 — ABNORMAL HIGH
Glucose, Bld: 171 — ABNORMAL HIGH
Sodium: 138

## 2011-04-05 LAB — APTT: aPTT: 27

## 2011-04-05 LAB — ETHANOL: Alcohol, Ethyl (B): <5

## 2011-04-05 LAB — ACETAMINOPHEN LEVEL: Acetaminophen (Tylenol), Serum: <10 — ABNORMAL LOW

## 2011-04-05 LAB — RAPID URINE DRUG SCREEN, HOSP PERFORMED
Amphetamines: NOT DETECTED
Benzodiazepines: NOT DETECTED
Cocaine: NOT DETECTED

## 2011-04-05 LAB — POCT CARDIAC MARKERS: Myoglobin, poc: 108

## 2011-04-05 LAB — PROTIME-INR: INR: 1

## 2011-04-09 LAB — GLUCOSE, CAPILLARY: Glucose-Capillary: 180 mg/dL — ABNORMAL HIGH (ref 70–99)

## 2011-04-26 ENCOUNTER — Other Ambulatory Visit (HOSPITAL_COMMUNITY): Payer: Self-pay | Admitting: Specialist

## 2011-04-26 DIAGNOSIS — Z1231 Encounter for screening mammogram for malignant neoplasm of breast: Secondary | ICD-10-CM

## 2011-05-17 ENCOUNTER — Encounter: Payer: Self-pay | Admitting: Internal Medicine

## 2011-05-24 ENCOUNTER — Ambulatory Visit (HOSPITAL_COMMUNITY)
Admission: RE | Admit: 2011-05-24 | Discharge: 2011-05-24 | Disposition: A | Discharge status: Home / Self Care | Payer: Self-pay | Source: Ambulatory Visit | Attending: Specialist | Admitting: Specialist

## 2011-05-24 DIAGNOSIS — Z1231 Encounter for screening mammogram for malignant neoplasm of breast: Secondary | ICD-10-CM | POA: Insufficient documentation

## 2011-06-02 ENCOUNTER — Telehealth: Payer: Self-pay | Admitting: Dietician

## 2011-06-02 DIAGNOSIS — E119 Type 2 diabetes mellitus without complications: Secondary | ICD-10-CM

## 2011-06-02 NOTE — Telephone Encounter (Signed)
Thank you Donna 

## 2011-06-02 NOTE — Telephone Encounter (Signed)
Called patient to confirm that she will be coming to her appointment on Monday, 06/07/11. She can get her microalbumin creatine ratio at that time. Order entered per Dr. Eben Burow

## 2011-06-07 ENCOUNTER — Encounter: Payer: Self-pay | Admitting: Internal Medicine

## 2011-06-07 ENCOUNTER — Ambulatory Visit (INDEPENDENT_AMBULATORY_CARE_PROVIDER_SITE_OTHER): Payer: Self-pay | Admitting: Internal Medicine

## 2011-06-07 VITALS — BP 151/76 | HR 84 | Temp 98.3°F | Wt 111.8 lb

## 2011-06-07 DIAGNOSIS — E785 Hyperlipidemia, unspecified: Secondary | ICD-10-CM

## 2011-06-07 DIAGNOSIS — E119 Type 2 diabetes mellitus without complications: Secondary | ICD-10-CM

## 2011-06-07 DIAGNOSIS — H269 Unspecified cataract: Secondary | ICD-10-CM

## 2011-06-07 DIAGNOSIS — Z23 Encounter for immunization: Secondary | ICD-10-CM

## 2011-06-07 DIAGNOSIS — I10 Essential (primary) hypertension: Secondary | ICD-10-CM

## 2011-06-07 LAB — GLUCOSE, CAPILLARY: Glucose-Capillary: 127 mg/dL — ABNORMAL HIGH (ref 70–99)

## 2011-06-07 MED ORDER — AMLODIPINE BESYLATE 10 MG PO TABS: 10.0000 mg | ORAL_TABLET | Freq: Every day | ORAL | Status: DC

## 2011-06-07 MED ORDER — TRIAMTERENE-HCTZ 37.5-25 MG PO CAPS: 1.0000 | ORAL_CAPSULE | Freq: Every day | ORAL | Status: DC

## 2011-06-07 NOTE — Progress Notes (Signed)
  Subjective:    Patient ID: Connie Powell, female    DOB: 1940-12-11, 70 y.o.   MRN: [id number]  HPI Patient is a 70 year old female with past medical history as noted in the chart most significant for diabetes and hypertension. She also has bilateral nuclear cataract and has been trying to get cataract surgery done. She hasn't been able to get the surgery done because she does not have insurance and cannot pay $1000 that the surgeons are asking her to pay out of pocket.  Her blood pressure is elevated today although she has been compliant with her Dyazide.  She denies any chest pain, shortness of breath or swelling.  Patient is due for flu shot.   Review of Systems  Constitutional: Negative for fever, activity change and appetite change.  HENT: Negative for sore throat.   Eyes: Positive for photophobia and visual disturbance.  Respiratory: Negative for cough and shortness of breath.   Cardiovascular: Negative for chest pain and leg swelling.  Gastrointestinal: Negative for nausea, abdominal pain, diarrhea, constipation and abdominal distention.  Genitourinary: Negative for frequency, hematuria and difficulty urinating.  Neurological: Negative for dizziness and headaches.  Psychiatric/Behavioral: Negative for suicidal ideas and behavioral problems.       Objective:   Physical Exam  Constitutional: She is oriented to person, place, and time. She appears well-developed and well-nourished.  HENT:  Head: Normocephalic and atraumatic.  Eyes: Conjunctivae and EOM are normal. Pupils are equal, round, and reactive to light. No scleral icterus.       Decreased visual acuity and patient has bilateral nuclear cataract.  Neck: Normal range of motion. Neck supple. No JVD present. No thyromegaly present.  Cardiovascular: Normal rate, regular rhythm, normal heart sounds and intact distal pulses.  Exam reveals no gallop and no friction rub.   No murmur heard. Pulmonary/Chest: Effort normal and  breath sounds normal. No respiratory distress. She has no wheezes. She has no rales.  Abdominal: Soft. Bowel sounds are normal. She exhibits no distension and no mass. There is no tenderness. There is no rebound and no guarding.  Musculoskeletal: Normal range of motion. She exhibits no edema and no tenderness.  Lymphadenopathy:    She has no cervical adenopathy.  Neurological: She is alert and oriented to person, place, and time.  Psychiatric: She has a normal mood and affect. Her behavior is normal.          Assessment & Plan:

## 2011-06-07 NOTE — Patient Instructions (Signed)

## 2011-06-08 LAB — MICROALBUMIN / CREATININE URINE RATIO
Creatinine, Urine: 80.9 mg/dL
Microalb, Ur: 0.5 mg/dL (ref 0.00–1.89)

## 2011-06-08 NOTE — Assessment & Plan Note (Signed)
I will add Norvasc to her existing drug regimen. Follow up in 2-3 weeks.

## 2011-06-08 NOTE — Assessment & Plan Note (Signed)
Patient is not taking simvastatin for a while and I would delete that from her list.

## 2011-06-08 NOTE — Assessment & Plan Note (Addendum)
Well-controlled at this time. We will check microalbumin creatinine ratio in urine today.

## 2011-06-08 NOTE — Assessment & Plan Note (Signed)
After discussion with Dr. Rogelia Boga it was decided that patient may benefit from a referral to [facility]. I will put that request.

## 2011-06-16 ENCOUNTER — Telehealth: Payer: Self-pay | Admitting: *Deleted

## 2011-06-16 NOTE — Telephone Encounter (Signed)
Pharmacy states that triam/hctz is not on the 3.99/ 9.99 list anymore, she would like to switch to something cheaper

## 2011-06-17 ENCOUNTER — Other Ambulatory Visit: Payer: Self-pay | Admitting: Internal Medicine

## 2011-06-17 DIAGNOSIS — I10 Essential (primary) hypertension: Secondary | ICD-10-CM

## 2011-06-17 MED ORDER — TRIAMTERENE-HCTZ 37.5-25 MG PO CAPS: 1.0000 | ORAL_CAPSULE | Freq: Every day | ORAL | Status: DC

## 2011-06-17 NOTE — Telephone Encounter (Signed)
Patient has been doing well on this medication for long now and had chronic cough which led to work up costs unnecessarily in the past and multiple office visits. Could you please call it to [facility] or [facility] of their choice as they have it for $4 for sure.  Thank you.

## 2011-06-21 ENCOUNTER — Ambulatory Visit (INDEPENDENT_AMBULATORY_CARE_PROVIDER_SITE_OTHER): Payer: Self-pay | Admitting: Internal Medicine

## 2011-06-21 ENCOUNTER — Encounter: Payer: Self-pay | Admitting: Internal Medicine

## 2011-06-21 DIAGNOSIS — E119 Type 2 diabetes mellitus without complications: Secondary | ICD-10-CM

## 2011-06-21 DIAGNOSIS — I10 Essential (primary) hypertension: Secondary | ICD-10-CM

## 2011-06-21 NOTE — Patient Instructions (Addendum)
Please make a followup appointment in first week of March 2013 for diabetes and blood pressure followup.  Meanwhile, check her blood pressure at home and if it's more than 140/90 on multiple occasions, call the clinic and left [place] know the exact blood pressure readings with exact days and which blood pressure medications she is currently on.  The nurse will send the telephone note to Dr. Eben Burow and he can likely make changes, if blood pressure is in comfortable range, on phone.  Continue low-salt diet and try to walk about 1 to 2 miles everyday. Take all your medications regularly including diabetes medications.

## 2011-06-21 NOTE — Assessment & Plan Note (Signed)
Lab Results  Component Value Date   NA 137 03/03/2011   K 4.1 03/03/2011   CL 99 03/03/2011   CO2 25 03/03/2011   BUN 34* 03/03/2011   CREATININE 0.99 03/03/2011   CREATININE 0.93 03/30/2010    BP Readings from Last 3 Encounters:  06/21/11 126/66  06/07/11 151/76  03/03/11 142/73    Assessment: Hypertension control:  controlled  Progress toward goals:  at goal Barriers to meeting goals:  no barriers identified  Plan: Hypertension treatment:  continue current medications She apparently is supposed to be on Norvasc and Dyazide, but she did not refill Dyazide due to cost. Still her blood pressure is within normal range. I recommended her to continue Norvasc and check her blood pressure at home regularly. Granddaughter who accompanied her said that they have blood pressure machine at home and she will check it everyday. She will call the clinic if her blood pressure stays more than 140/90 on multiple occasions in which case we can prescribe just HCTZ.

## 2011-06-21 NOTE — Assessment & Plan Note (Signed)
Lab Results  Component Value Date   HGBA1C 6.6 06/07/2011   HGBA1C 6.6 03/30/2010   CREATININE 0.99 03/03/2011   CREATININE 0.93 03/30/2010   MICROALBUR 0.50 06/07/2011   MICRALBCREAT 6.2 06/07/2011   CHOL 144 03/03/2011   HDL 44 03/03/2011   TRIG 75 03/03/2011    Last eye exam and foot exam:    Component Value Date/Time   HMDIABEYEEXA no diabetic retinopathy, cataract 04/11/2008   HMDIABFOOTEX done 01/19/2010    Assessment: Diabetes control: controlled Progress toward goals: at goal Barriers to meeting goals: no barriers identified  Plan: Diabetes treatment: continue current medications Refer to: none Instruction/counseling given: discussed diet

## 2011-06-21 NOTE — Progress Notes (Signed)
  Subjective:    Patient ID: Connie Powell, female    DOB: 1940-09-27, 70 y.o.   MRN: [id number]  HPI Connie Powell is a pleasant 70 year woman with past with history of well-controlled DM 2, hypertension who comes the clinic for followup for hypertension after recent change in medication.  She was seen by her primary care Dr. on 06/07/2011 and her blood pressure was mildly elevated after being on Dyazide. She was started on Norvasc 10 mg daily along with Dyazide 37.5-25 mg daily. Her blood pressure today is 126/66 which is the best reading we have in last one year. She is just on Norvasc currently, as when she went to get the refill for Dyazide- it was around $40 and so did not refill it.  Otherwise she denies any headache, leg swelling, vision changes, palpitations. She denies any fever, chills, nausea vomiting or abdominal pain, chest pain, shortness of breath.    Review of Systems    As per history of present illness, all other systems reviewed and negative.  Objective:   Physical Exam General: NAD HEENT: PERRL, EOMI, no scleral icterus Cardiac: S1, S2, RRR, no rubs, murmurs or gallops Pulm: clear to auscultation bilaterally, moving normal volumes of air Abd: soft, nontender, nondistended, BS present Ext: warm and well perfused, no pedal edema Neuro: alert and oriented X3, cranial nerves II-XII grossly intact        Assessment & Plan:

## 2011-06-23 NOTE — Telephone Encounter (Signed)
I talked to pt's granddaughter who stated at her last office visit; pt had only been taking 1 medication and her BP was low. Pt saw Dr Allena Katz 12/17 and BP was 126/66; instructed to take only the 1 medication(Norvasc) and to call if BP >140/90 on multiple occasions, which HCTZ may be added. Will not call in rx for Dyazide to [facility] on [address] at this time; granddaughter agrees.

## 2011-07-22 ENCOUNTER — Other Ambulatory Visit: Payer: Self-pay | Admitting: Internal Medicine

## 2011-07-22 DIAGNOSIS — H269 Unspecified cataract: Secondary | ICD-10-CM

## 2011-07-22 NOTE — Progress Notes (Signed)
Received referral to diabetic exam report from Dr. Blair Promise stating patient needs cataract removal surgery. Referral placed today for ophthalmology patient will need free services given that she has no insurance through [facility]

## 2011-12-07 ENCOUNTER — Ambulatory Visit (INDEPENDENT_AMBULATORY_CARE_PROVIDER_SITE_OTHER): Payer: Self-pay | Admitting: Internal Medicine

## 2011-12-07 VITALS — BP 130/70 | HR 70 | Wt 111.2 lb

## 2011-12-07 DIAGNOSIS — R87612 Low grade squamous intraepithelial lesion on cytologic smear of cervix (LGSIL): Secondary | ICD-10-CM

## 2011-12-07 DIAGNOSIS — H269 Unspecified cataract: Secondary | ICD-10-CM

## 2011-12-07 DIAGNOSIS — Z79899 Other long term (current) drug therapy: Secondary | ICD-10-CM

## 2011-12-07 DIAGNOSIS — Z1231 Encounter for screening mammogram for malignant neoplasm of breast: Secondary | ICD-10-CM

## 2011-12-07 DIAGNOSIS — E119 Type 2 diabetes mellitus without complications: Secondary | ICD-10-CM

## 2011-12-07 DIAGNOSIS — I10 Essential (primary) hypertension: Secondary | ICD-10-CM

## 2011-12-07 DIAGNOSIS — R928 Other abnormal and inconclusive findings on diagnostic imaging of breast: Secondary | ICD-10-CM

## 2011-12-07 LAB — POCT GLYCOSYLATED HEMOGLOBIN (HGB A1C): Hemoglobin A1C: 6

## 2011-12-07 LAB — GLUCOSE, CAPILLARY: Glucose-Capillary: 94 mg/dL (ref 70–99)

## 2011-12-07 NOTE — Assessment & Plan Note (Signed)
The right eye was operated in [place] and there is plan to do left eye catatact removal. She has appointment tomorrow.

## 2011-12-07 NOTE — Assessment & Plan Note (Signed)
Well-controlled  at this time 

## 2011-12-07 NOTE — Assessment & Plan Note (Signed)
I would repeat a mammogram at this time as she has not has had one in a while and last one was abnormal.

## 2011-12-07 NOTE — Progress Notes (Signed)
  Subjective:    Patient ID: Connie Powell, female    DOB: [birth year], 71 y.o.   MRN: [id number]  HPI Patient is here today for a regular check up.  Her BP is 130/71 when it was repeated in the room.  Her HBa1c is 6.0 today.  No complaints at this time.    Review of Systems  Constitutional: Negative for fever, activity change and appetite change.  HENT: Negative for sore throat.   Respiratory: Negative for cough and shortness of breath.   Cardiovascular: Negative for chest pain and leg swelling.  Gastrointestinal: Negative for nausea, abdominal pain, diarrhea, constipation and abdominal distention.  Genitourinary: Negative for frequency, hematuria and difficulty urinating.  Neurological: Negative for dizziness and headaches.  Psychiatric/Behavioral: Negative for suicidal ideas and behavioral problems.       Objective:   Physical Exam  Constitutional: She is oriented to person, place, and time. She appears well-developed and well-nourished.  HENT:  Head: Normocephalic and atraumatic.  Eyes: Conjunctivae and EOM are normal. Pupils are equal, round, and reactive to light. No scleral icterus.  Neck: Normal range of motion. Neck supple. No JVD present. No thyromegaly present.  Cardiovascular: Normal rate, regular rhythm, normal heart sounds and intact distal pulses.  Exam reveals no gallop and no friction rub.   No murmur heard. Pulmonary/Chest: Effort normal and breath sounds normal. No respiratory distress. She has no wheezes. She has no rales.  Abdominal: Soft. Bowel sounds are normal. She exhibits no distension and no mass. There is no tenderness. There is no rebound and no guarding.  Musculoskeletal: Normal range of motion. She exhibits no edema and no tenderness.  Lymphadenopathy:    She has no cervical adenopathy.  Neurological: She is alert and oriented to person, place, and time.  Psychiatric: She has a normal mood and affect. Her behavior is normal.            Assessment & Plan:

## 2011-12-07 NOTE — Assessment & Plan Note (Signed)
Patient has not followed up for this in >1 year and requests another appointment.

## 2011-12-07 NOTE — Patient Instructions (Signed)

## 2012-01-12 ENCOUNTER — Encounter: Payer: Self-pay | Admitting: Physician Assistant

## 2012-02-17 ENCOUNTER — Encounter: Payer: Self-pay | Admitting: Obstetrics & Gynecology

## 2012-03-28 ENCOUNTER — Other Ambulatory Visit: Payer: Self-pay | Admitting: Internal Medicine

## 2012-04-10 ENCOUNTER — Ambulatory Visit (INDEPENDENT_AMBULATORY_CARE_PROVIDER_SITE_OTHER): Payer: Self-pay | Admitting: Internal Medicine

## 2012-04-10 ENCOUNTER — Encounter: Payer: Self-pay | Admitting: Internal Medicine

## 2012-04-10 VITALS — BP 189/81 | HR 60 | Temp 97.2°F | Wt 109.7 lb

## 2012-04-10 DIAGNOSIS — I10 Essential (primary) hypertension: Secondary | ICD-10-CM

## 2012-04-10 DIAGNOSIS — H269 Unspecified cataract: Secondary | ICD-10-CM

## 2012-04-10 DIAGNOSIS — E119 Type 2 diabetes mellitus without complications: Secondary | ICD-10-CM

## 2012-04-10 DIAGNOSIS — R87612 Low grade squamous intraepithelial lesion on cytologic smear of cervix (LGSIL): Secondary | ICD-10-CM

## 2012-04-10 DIAGNOSIS — Z79899 Other long term (current) drug therapy: Secondary | ICD-10-CM

## 2012-04-10 LAB — BASIC METABOLIC PANEL WITH GFR
CO2: 29 mEq/L (ref 19–32)
Chloride: 104 mEq/L (ref 96–112)
Glucose, Bld: 89 mg/dL (ref 70–99)
Potassium: 4.4 mEq/L (ref 3.5–5.3)
Sodium: 141 mEq/L (ref 135–145)

## 2012-04-10 LAB — POCT GLYCOSYLATED HEMOGLOBIN (HGB A1C): Hemoglobin A1C: 5.9

## 2012-04-10 MED ORDER — LISINOPRIL-HYDROCHLOROTHIAZIDE 10-12.5 MG PO TABS: 1.0000 | ORAL_TABLET | Freq: Every day | ORAL | Status: DC

## 2012-04-10 MED ORDER — AMLODIPINE BESYLATE 10 MG PO TABS: 10.0000 mg | ORAL_TABLET | Freq: Every day | ORAL | Status: DC

## 2012-04-10 MED ORDER — METFORMIN HCL 1000 MG PO TABS: 1000.0000 mg | ORAL_TABLET | Freq: Two times a day (BID) | ORAL | Status: DC

## 2012-04-10 NOTE — Assessment & Plan Note (Signed)
Got operated in one eye at [facility] and getting other one treated soon.

## 2012-04-10 NOTE — Progress Notes (Signed)
  Subjective:    Patient ID: Connie Powell, female    DOB: [birth year], 71 y.o.   MRN: [id number]  HPI  Patient is here today for routine follow up.  Her BP is high since we took her off dyazide due to cost.  Had her eyes cataract operated last month.  Flu shot denied today.   Review of Systems  Constitutional: Negative for fever, activity change and appetite change.  HENT: Negative for sore throat.   Respiratory: Negative for cough and shortness of breath.   Cardiovascular: Negative for chest pain and leg swelling.  Gastrointestinal: Negative for nausea, abdominal pain, diarrhea, constipation and abdominal distention.  Genitourinary: Negative for frequency, hematuria and difficulty urinating.  Neurological: Negative for dizziness and headaches.  Psychiatric/Behavioral: Negative for suicidal ideas and behavioral problems.       Objective:   Physical Exam  Constitutional: She is oriented to person, place, and time. She appears well-developed and well-nourished.  HENT:  Head: Normocephalic and atraumatic.  Eyes: Conjunctivae normal and EOM are normal. Pupils are equal, round, and reactive to light. No scleral icterus.  Neck: Normal range of motion. Neck supple. No JVD present. No thyromegaly present.  Cardiovascular: Normal rate, regular rhythm, normal heart sounds and intact distal pulses.  Exam reveals no gallop and no friction rub.   No murmur heard. Pulmonary/Chest: Effort normal and breath sounds normal. No respiratory distress. She has no wheezes. She has no rales.  Abdominal: Soft. Bowel sounds are normal. She exhibits no distension and no mass. There is no tenderness. There is no rebound and no guarding.  Musculoskeletal: Normal range of motion. She exhibits no edema and no tenderness.  Lymphadenopathy:    She has no cervical adenopathy.  Neurological: She is alert and oriented to person, place, and time.  Psychiatric: She has a normal mood and affect. Her behavior is  normal.          Assessment & Plan:

## 2012-04-10 NOTE — Patient Instructions (Addendum)

## 2012-04-10 NOTE — Assessment & Plan Note (Signed)
Add HCTZ and lisinopril and call back in 2 weeks for BMP recheck.

## 2012-04-10 NOTE — Assessment & Plan Note (Signed)
No changes to meds toady. At goal.

## 2012-04-10 NOTE — Assessment & Plan Note (Signed)
Did not complete referral as she was late for appointment. Will send her again. No symptoms at this time.

## 2012-04-24 ENCOUNTER — Ambulatory Visit: Payer: Self-pay | Admitting: Internal Medicine

## 2012-05-01 ENCOUNTER — Ambulatory Visit: Payer: Self-pay | Admitting: Internal Medicine

## 2012-05-10 ENCOUNTER — Ambulatory Visit (INDEPENDENT_AMBULATORY_CARE_PROVIDER_SITE_OTHER): Payer: Self-pay | Admitting: Internal Medicine

## 2012-05-10 ENCOUNTER — Encounter: Payer: Self-pay | Admitting: Internal Medicine

## 2012-05-10 VITALS — BP 143/71 | HR 72 | Temp 96.8°F | Ht 60.5 in | Wt 108.8 lb

## 2012-05-10 DIAGNOSIS — I10 Essential (primary) hypertension: Secondary | ICD-10-CM

## 2012-05-10 DIAGNOSIS — R928 Other abnormal and inconclusive findings on diagnostic imaging of breast: Secondary | ICD-10-CM

## 2012-05-10 DIAGNOSIS — E119 Type 2 diabetes mellitus without complications: Secondary | ICD-10-CM

## 2012-05-10 DIAGNOSIS — H2510 Age-related nuclear cataract, unspecified eye: Secondary | ICD-10-CM

## 2012-05-10 DIAGNOSIS — R87612 Low grade squamous intraepithelial lesion on cytologic smear of cervix (LGSIL): Secondary | ICD-10-CM

## 2012-05-10 DIAGNOSIS — H269 Unspecified cataract: Secondary | ICD-10-CM

## 2012-05-10 LAB — GLUCOSE, CAPILLARY

## 2012-05-10 NOTE — Assessment & Plan Note (Signed)
Mammogram rescheduled. 

## 2012-05-10 NOTE — Patient Instructions (Signed)
Please make a followup appointment in 3-4 months. Continue taking all medications regularly. Followup for Pap smear and mammogram as scheduled.

## 2012-05-10 NOTE — Assessment & Plan Note (Signed)
Left cataract surgery done on 04/27/2012 at [facility].

## 2012-05-10 NOTE — Assessment & Plan Note (Signed)
Lab Results  Component Value Date   NA 141 04/10/2012   K 4.4 04/10/2012   CL 104 04/10/2012   CO2 29 04/10/2012   BUN 18 04/10/2012   CREATININE 0.89 04/10/2012   CREATININE 0.93 03/30/2010    BP Readings from Last 3 Encounters:  05/10/12 143/71  04/10/12 189/81  12/07/11 130/70    Assessment: Hypertension control:  mildly elevated  Progress toward goals:  improved Barriers to meeting goals:  no barriers identified  Plan: Hypertension treatment:  continue current medications. Continue lisinopril/HCTZ and Norvasc.

## 2012-05-10 NOTE — Progress Notes (Signed)
  Subjective:    Patient ID: Connie Powell, female    DOB: [birth year], 71 y.o.   MRN: [id number]  HPI patient is a pleasant 71 year old woman with past history of uncontrolled DM 2, hyper lipidemia, cataract, hypertension who comes in the clinic for followup for blood pressure. She was in our clinic last month when her blood pressure was elevated at 180 systolic. She was advised to followup in 2 weeks for which she came today. Her blood pressure today is 143/71 which is much better than last reading. She denies any symptoms of chest pain, short of breath, nausea vomiting, abdominal pain, fever, chills.  She still hasn't got her Pap smear or mammogram done.    Review of Systems    As per history of present illness, all other systems reviewed and negative. Objective:   Physical Exam  General: NAD. Wearing protective glasses post cataract surgery. HEENT: PERRL, EOMI, no scleral icterus Cardiac: S1, S2, RRR, no rubs, murmurs or gallops Pulm: clear to auscultation bilaterally, moving normal volumes of air Abd: soft, nontender, nondistended, BS present Ext: warm and well perfused, no pedal edema Neuro: alert and oriented X3, cranial nerves II-XII grossly intact       Assessment & Plan:

## 2012-05-10 NOTE — Assessment & Plan Note (Signed)
[facility] She was late at her last appointment and so did not get it.

## 2012-05-22 ENCOUNTER — Encounter: Payer: Self-pay | Admitting: Obstetrics & Gynecology

## 2012-05-24 ENCOUNTER — Ambulatory Visit (HOSPITAL_COMMUNITY)
Admission: RE | Admit: 2012-05-24 | Discharge: 2012-05-24 | Disposition: A | Discharge status: Home / Self Care | Payer: Self-pay | Source: Ambulatory Visit | Attending: Internal Medicine | Admitting: Internal Medicine

## 2012-05-24 DIAGNOSIS — Z1231 Encounter for screening mammogram for malignant neoplasm of breast: Secondary | ICD-10-CM | POA: Insufficient documentation

## 2012-05-26 ENCOUNTER — Ambulatory Visit (INDEPENDENT_AMBULATORY_CARE_PROVIDER_SITE_OTHER): Payer: Self-pay | Admitting: Obstetrics & Gynecology

## 2012-05-26 ENCOUNTER — Encounter: Payer: Self-pay | Admitting: Obstetrics & Gynecology

## 2012-05-26 VITALS — BP 130/69 | HR 63 | Temp 97.3°F | Ht 59.5 in | Wt 107.7 lb

## 2012-05-26 DIAGNOSIS — R6889 Other general symptoms and signs: Secondary | ICD-10-CM

## 2012-05-26 DIAGNOSIS — IMO0002 Reserved for concepts with insufficient information to code with codable children: Secondary | ICD-10-CM

## 2012-05-26 NOTE — Progress Notes (Signed)
Subjective:     Patient ID: Connie Powell, female   DOB: 1940/07/13, 71 y.o.   MRN: [id number]  HPI Pt is a 71 yo AA female with h/o abnormal PAP in 10/2010 which she never had f/u for.  Pt  Denies any abnl bleeding or discharge.  Review of Systems     Objective:   Physical ExamBP 130/69  Pulse 63  Temp 97.3 F (36.3 C)  Ht 4' 11.5" (1.511 m)  Wt 107 lb 11.2 oz (48.852 kg)  BMI 21.39 kg/m2 GU: EGBUS: no lesions Vagina: no blood in vault; atrophy Cervix: no lesion; no mucopurulent d/c Uterus: small, mobile Adnexa: no masses; non tender      Assessment:     Abnormal PAP > than 1 year prev     Plan:     F/u PAP with HPV If abnormal, pt needs a colpo. Pt has given permission for her grand daughter  Hortencia Pilar to accept resutls: [phone]  Eber Jones L. Harraway-Smith, M.D., [facility]n Core

## 2012-05-26 NOTE — Patient Instructions (Signed)
Cervical Dysplasia Cervical dysplasia is a condition in which a woman has abnormal changes in the cells of her cervix. The cervix is the opening to the uterus (womb) between the vagina and the uterus. These changes are called cervical dysplasia and may be the first signs of cervical cancer. These cells can be taken from the cervix during a Pap test and then looked at under a microscope. With early detection, treatment, and close follow-up care, nearly all cervical dysplasia can be cured. If untreated, the mild to moderate stages of dysplasia often grow more severe.  RISK FACTORS  The following increase the risk for cervical dysplasia.  Having had a sexually transmitted disease, including:  Chlamydia.  Human papilloma virus (HPV).  Becoming sexually active before age 18.  Having had more than 1 sexual partner.  Not using protection, such as condoms, during sexual intercourse, especially with new sexual partners.  Having had cancer of the vagina or vulva.  Having a sexual partner whose previous partner had cancer of the cervix or cervical dysplasia.  Having a sexual partner who has or has had cancer of the penis.  Having a weakened immune system (HIV, organ transplant).  Being the daughter of a woman who took DES (diethylstilbestrol) during pregnancy.  A history of cervical cancer in a woman's sister or mother.  Smoking.  Having had an abnormal Pap test in the past. SYMPTOMS  There are usually no symptoms. If there are symptoms, they may be vague such as:  Abnormal vaginal discharge.  Bleeding between periods or following intercourse.  Bleeding during menopause.  Pain on intercourse (dyspareunia). DIAGNOSIS   The Pap test is the best way of detecting abnormalities of the cervix.  Biopsy (removing a piece of tissue to look at under the microscope) of the cervix when the Pap test is abnormal or when the Pap test is normal, but the cervix looks abnormal. TREATMENT    Catching and treating the changes early with Pap tests can prevent cervical cancer.  Cryotherapy freezes the abnormal cells with a steel tip instrument.  A laser can be used to remove the abnormal cells.  Loop electrocautery excision procedure (LEEP). This procedure uses a heated electrical loop to remove a cone-like portion of the cervix, including the cervical canal.  For more serious cases of cervical dysplasia, the abnormal tissue may be removed surgically by:  A cone biopsy (by cold knife, laser or LEEP). A procedure in which a portion of the center of the cervix with the cervical canal is removed.  The uterus and cervix are removed (hysterectomy). Your caregiver will advise you regarding the need and timing of Pap tests in your follow-up. Women who have been treated for dysplasia should be closely followed with pelvic exams and Pap tests. During the first year following treatment of cervical dysplasia, Pap tests should be done every 3 to 4 months. In the second year, the schedule is every 6 months, or as recommended by your caregiver. See your caregiver for new or worsening problems. HOME CARE INSTRUCTIONS   Follow the instructions and recommendations of your caregiver regarding medicines and follow-up appointments.  Only take over-the-counter or prescription medicines for pain or discomfort as directed by your caregiver.  Cramping and pelvic discomfort may follow cryotherapy. It is not abnormal to have watery discharge for several weeks after.  Laser, cone surgery, cryotherapy or LEEP can cause a bad smelling vaginal discharge. It may also cause vaginal bleeding for a couple weeks following the procedure. The   discharge may be black from the paste used to control bleeding from the cone site. This is normal.  Do not use tampons, have sexual intercourse or douche until your caregiver says it is okay. SEEK MEDICAL CARE IF:   You develop genital warts.  You need a prescription for  pain medicine following your treatment. SEEK IMMEDIATE MEDICAL CARE IF:   Your bleeding is heavier than a normal menstrual period.  You develop bright red bleeding, especially if you have blood clots.  You have a fever.  You have increasing cramps or pain not relieved with medicine.  You are lightheaded, unusually weak, or have fainting spells.  You have abnormal vaginal discharge.  You develop abdominal pain. PREVENTION   The surest way to prevent cervical dysplasia is to abstain from sexual intercourse.  Practice safe sex, use condoms and have only one sex partner who does not have other sex partners.  A Pap test is done to screen for cervical cancer.  The first Pap test should be done at age 21.  Between ages 21 and 29, Pap tests are repeated every 2 years.  Beginning at age 30, you are advised to have a Pap test every 3 years as long as your past 3 Pap tests have been normal.  Some women have medical problems that increase the chance of getting cervical cancer. Talk to your caregiver about these problems. It is especially important to talk to your caregiver if a new problem develops soon after your last Pap test. In these cases, your caregiver may recommend more frequent screening and Pap tests.  The above recommendations are the same for women who have or have not gotten the vaccine for HPV (Human Papillomavirus).  If you had a hysterectomy for a problem that was not a cancer or a condition that could lead to cancer, then you no longer need Pap tests. However, even if you no longer need a Pap test, a regular exam is a good idea to make sure no other problems are starting.   If you are between ages 65 and 70, and you have had normal Pap tests going back 10 years, you no longer need Pap tests. However, even if you no longer need a Pap test, a regular exam is a good idea to make sure no other problems are starting.   If you have had past treatment for cervical cancer or a  condition that could lead to cancer, you need Pap tests and screening for cancer for at least 20 years after your treatment.  If Pap tests have been discontinued, risk factors (such as a new sexual partner) need to be re-assessed to determine if screening should be resumed.  Some women may need screenings more often if they are at high risk for cervical cancer.  Your caregiver may do additional tests including:  Colposcopy. A procedure in which a special microscope magnifies the cells and allows the provider to closely examine the cervix, vagina, and vulva.  Biopsy. A small tissue sample is taken from the cervix, vagina or vulva. This is generally done in your caregivers office.  A cone biopsy (cold knife or laser). A large tissue sample is taken from the cervix. This procedure is usually done in an operating room under a general anesthetic. The cone often removes all abnormal tissue and so may also complete the treatment.  LEEP, also removing a circular portion of the cervix and is done in a doctors office under a local anesthetic.  Now   there is a vaccine, Gardasil, that was developed to prevent the HPV'S that can cause cancer of the cervix and genital warts. It is recommended for females ages 76 to 83. It should not be given to pregnant women until more is known about its effects on the fetus. Not all cancers of the cervix are caused by the HPV. Routine gynecology exams and Pap tests should continue as recommended by your caregiver. Document Released: 06/21/2005 Document Revised: 09/13/2011 Document Reviewed: 06/12/2008 [facility] Patient Information 2013 [place], [place]. Pap Test A Pap test is a procedure done in a clinic office to evaluate cells that are on the surface of the cervix. The cervix is the lower portion of the uterus and upper portion of the vagina. For some women, the cervical region has the potential to form cancer. With consistent evaluations by your caregiver, this type of  cancer can be prevented.  If a Pap test is abnormal, it is most often a result of a previous exposure to human papillomavirus (HPV). HPV is a virus that can infect the cells of the cervix and cause dysplasia. Dysplasia is where the cells no longer look normal. If a woman has been diagnosed with high-grade or severe dysplasia, they are at higher risk of developing cervical cancer. People diagnosed with low-grade dysplasia should still be seen by their caregiver because there is a small chance that low-grade dysplasia could develop into cancer.  LET YOUR CAREGIVER KNOW ABOUT:  Recent sexually transmitted infection (STI) you have had.  Any new sex partners you have had.  History of previous abnormal Pap tests results.  History of previous cervical procedures you have had (colposcopy, biopsy, loop electrosurgical excision procedure [LEEP]).  Concerns you have had regarding unusual vaginal discharge.  History of pelvic pain.  Your use of birth control. BEFORE THE PROCEDURE  Ask your caregiver when to schedule your Pap test. It is best not to be on your period if your caregiver uses a wooden spatula to collect cells or applies cells to a glass slide. Newer techniques are not so sensitive to the timing of a menstrual cycle.  Do not douche or have sexual intercourse for 24 hours before the test.   Do not use vaginal creams or tampons for 24 hours before the test.   Empty your bladder just before the test to lessen any discomfort.  PROCEDURE You will lie on an exam table with your feet in stirrups. A warm metal or plastic instrument (speculum) is placed in your vagina. This instrument allows your caregiver to see the inside of your vagina and look at your cervix. A small, plastic brush or wooden spatula is then used to collect cervical cells. These cells are placed in a lab specimen container. The cells are looked at under a microscope. A specialist will determine if the cells are normal.    AFTER THE PROCEDURE Make sure to get your test results.If your results come back abnormal, you may need further testing.  Document Released: 09/11/2002 Document Revised: 09/13/2011 Document Reviewed: 06/17/2011 [facility] Patient Information 2013 [place], [place].

## 2012-06-06 ENCOUNTER — Telehealth: Payer: Self-pay | Admitting: *Deleted

## 2012-06-06 NOTE — Telephone Encounter (Signed)
Message copied by Mannie Stabile on Tue Jun 06, 2012  9:37 AM ------      Message from: [place], VANESSA G      Created: Mon Jun 05, 2012  3:27 PM       12-18-@2 :15pm      ----- Message -----         From: Freddi Starr, CMA         Sent: 06/05/2012   1:47 PM           To: [facility] Admin Pool            Please schedule colpo and send appointment information to clinical pool so that we can give to patient when we inform her of her results.             Thanks!                  ----- Message -----         From: Willodean Rosenthal, MD         Sent: 06/05/2012  10:52 AM           To: [facility] Clinical Pool            Please call pt  and notify of abnormal PAP and need for Colpo.  Pls schedule colpo            Thx,      clh-S

## 2012-06-06 NOTE — Telephone Encounter (Signed)
Attempted to contact patient, no answer.

## 2012-06-14 NOTE — Telephone Encounter (Addendum)
Called pt's grand daughter Hortencia Pilar @ [phone] to provide test results as the note from Dr. Erin Fulling on 11/22 states that pt gave permission for results to be given to [facility].  Pt also does not speak English and the chart does not state her language, therefore interpreter cannot be used at this time.  I left a messgae on [name]'s voice mail that I was calling with test results and to please call back to indicate when she can be reached or if she would like the results to be left on her voice mail.  ** When [place] calls back, we will ask what language Briea speaks and document in demographics section.  12/18  0930-Called [name] and left new message that I need to speak with her regarding her grandmother's test results and an appt. Please call today and ask to speak with me.

## 2012-06-21 ENCOUNTER — Encounter: Payer: Self-pay | Admitting: Obstetrics & Gynecology

## 2012-06-29 NOTE — Telephone Encounter (Signed)
Called Connie Powell- pt's grand daughter and left message that it is very important that I speak with her regarding test results and another appt needed for her grandmother. Please call and leave a new message on the nurse voice mail.  Frisco Powell needs to be informed that the Pap was abnormal and her grandmother will need Colpo appt.

## 2012-07-24 NOTE — Telephone Encounter (Signed)
Called and spoke w/[name]- pt's grand daughter. She stated that she has not returned my previous calls because "she could not read the number on her phone."  I informed Olean Sink of Anginette's abnormal Pap and need for follow up evaluation w/Colposcopy.  Central Sink voiced understanding and stated that she is familiar with the procedure. She agreed to appt for her grandmother on 08/07/12 @ 1500. She stated that her sister can bring [facility]. She had no further questions.

## 2012-08-07 ENCOUNTER — Encounter: Payer: Self-pay | Admitting: Obstetrics and Gynecology

## 2012-09-07 ENCOUNTER — Ambulatory Visit: Payer: Self-pay

## 2012-09-13 ENCOUNTER — Ambulatory Visit: Payer: Self-pay

## 2012-09-25 ENCOUNTER — Ambulatory Visit (INDEPENDENT_AMBULATORY_CARE_PROVIDER_SITE_OTHER): Payer: No Typology Code available for payment source | Admitting: Internal Medicine

## 2012-09-25 ENCOUNTER — Encounter: Payer: Self-pay | Admitting: Internal Medicine

## 2012-09-25 VITALS — BP 136/70 | HR 71 | Temp 97.2°F | Ht 59.5 in | Wt 107.5 lb

## 2012-09-25 DIAGNOSIS — K089 Disorder of teeth and supporting structures, unspecified: Secondary | ICD-10-CM

## 2012-09-25 DIAGNOSIS — I10 Essential (primary) hypertension: Secondary | ICD-10-CM

## 2012-09-25 DIAGNOSIS — E785 Hyperlipidemia, unspecified: Secondary | ICD-10-CM

## 2012-09-25 DIAGNOSIS — Z598 Other problems related to housing and economic circumstances: Secondary | ICD-10-CM

## 2012-09-25 DIAGNOSIS — E119 Type 2 diabetes mellitus without complications: Secondary | ICD-10-CM

## 2012-09-25 DIAGNOSIS — K029 Dental caries, unspecified: Secondary | ICD-10-CM

## 2012-09-25 DIAGNOSIS — Z5987 Material hardship due to limited financial resources, not elsewhere classified: Secondary | ICD-10-CM

## 2012-09-25 DIAGNOSIS — K0889 Other specified disorders of teeth and supporting structures: Secondary | ICD-10-CM

## 2012-09-25 LAB — POCT GLYCOSYLATED HEMOGLOBIN (HGB A1C): Hemoglobin A1C: 6.3

## 2012-09-25 LAB — GLUCOSE, CAPILLARY: Glucose-Capillary: 79 mg/dL (ref 70–99)

## 2012-09-25 MED ORDER — HYDROCODONE-ACETAMINOPHEN 5-325 MG PO TABS: 1.0000 | ORAL_TABLET | Freq: Four times a day (QID) | ORAL | Status: DC | PRN

## 2012-09-25 MED ORDER — AMOXICILLIN 500 MG PO CAPS: 500.0000 mg | ORAL_CAPSULE | Freq: Two times a day (BID) | ORAL | Status: DC

## 2012-09-25 MED ORDER — NAPROXEN 500 MG PO TABS: 500.0000 mg | ORAL_TABLET | Freq: Two times a day (BID) | ORAL | Status: DC

## 2012-09-25 NOTE — Assessment & Plan Note (Signed)
Patient to meet up with Stanton Kidney to discuss her bills from [facility] regarding eye care surgeries.

## 2012-09-25 NOTE — Patient Instructions (Signed)
Dental Abscess  A dental abscess is a collection of infected fluid (pus) from a bacterial infection in the inner part of the tooth (pulp). It usually occurs at the end of the tooth's root.   CAUSES    Severe tooth decay.   Trauma to the tooth that allows bacteria to enter into the pulp, such as a broken or chipped tooth.  SYMPTOMS    Severe pain in and around the infected tooth.   Swelling and redness around the abscessed tooth or in the mouth or face.   Tenderness.   Pus drainage.   Bad breath.   Bitter taste in the mouth.   Difficulty swallowing.   Difficulty opening the mouth.   Nausea.   Vomiting.   Chills.   Swollen neck glands.  DIAGNOSIS    A medical and dental history will be taken.   An examination will be performed by tapping on the abscessed tooth.   X-rays may be taken of the tooth to identify the abscess.  TREATMENT  The goal of treatment is to eliminate the infection. You may be prescribed antibiotic medicine to stop the infection from spreading. A root canal may be performed to save the tooth. If the tooth cannot be saved, it may be pulled (extracted) and the abscess may be drained.   HOME CARE INSTRUCTIONS   Only take over-the-counter or prescription medicines for pain, fever, or discomfort as directed by your caregiver.   Rinse your mouth (gargle) often with salt water ( tsp salt in 8 oz of warm water) to relieve pain or swelling.   Do not drive after taking pain medicine (narcotics).   Do not apply heat to the outside of your face.   Return to your dentist for further treatment as directed.  SEEK MEDICAL CARE IF:   Your pain is not helped by medicine.   Your pain is getting worse instead of better.  SEEK IMMEDIATE MEDICAL CARE IF:   You have a fever or persistent symptoms for more than 2 3 days.   You have a fever and your symptoms suddenly get worse.   You have chills or a very bad headache.   You have problems breathing or swallowing.   You have trouble opening your  mouth.   You have swelling in the neck or around the eye.  Document Released: 06/21/2005 Document Revised: 12/21/2011 Document Reviewed: 09/29/2010  [employer] Patient Information 2013 [employer].

## 2012-09-25 NOTE — Assessment & Plan Note (Signed)
No need for statin based on last lipid panel.

## 2012-09-25 NOTE — Assessment & Plan Note (Signed)
Well controlled 

## 2012-09-25 NOTE — Progress Notes (Signed)
  Subjective:    Patient ID: Connie Powell, female    DOB: 1941/05/24, 72 y.o.   MRN: [id number]  Dental Pain  Pertinent negatives include no fever.   Patient is here today for dental pain in the right lower tooth which started 5 days ago. It is much better today. No fever or chills noted.  Patient called her dentist who does not have an appointment for her acutely.  No other complaints.  Review of Systems  Constitutional: Negative for fever, activity change and appetite change.  HENT: Positive for dental problem. Negative for sore throat.   Respiratory: Negative for cough and shortness of breath.   Cardiovascular: Negative for chest pain and leg swelling.  Gastrointestinal: Negative for nausea, abdominal pain, diarrhea, constipation and abdominal distention.  Genitourinary: Negative for frequency, hematuria and difficulty urinating.  Neurological: Negative for dizziness and headaches.  Psychiatric/Behavioral: Negative for suicidal ideas and behavioral problems.       Objective:   Physical Exam  Constitutional: She is oriented to person, place, and time. She appears well-developed and well-nourished.  HENT:  Head: Normocephalic and atraumatic.  Mouth/Throat: Abnormal dentition. Dental caries present.  Eyes: Conjunctivae and EOM are normal. Pupils are equal, round, and reactive to light. No scleral icterus.  Neck: Normal range of motion. Neck supple. No JVD present. No thyromegaly present.  Cardiovascular: Normal rate, regular rhythm, normal heart sounds and intact distal pulses.  Exam reveals no gallop and no friction rub.   No murmur heard. Pulmonary/Chest: Effort normal and breath sounds normal. No respiratory distress. She has no wheezes. She has no rales.  Abdominal: Soft. Bowel sounds are normal. She exhibits no distension and no mass. There is no tenderness. There is no rebound and no guarding.  Musculoskeletal: Normal range of motion. She exhibits no edema and no tenderness.   Lymphadenopathy:    She has no cervical adenopathy.  Neurological: She is alert and oriented to person, place, and time.  Psychiatric: She has a normal mood and affect. Her behavior is normal.          Assessment & Plan:

## 2012-09-25 NOTE — Assessment & Plan Note (Signed)
No abscess noted on exam. Given prescription for vicodin and naproxen to be alternately used for pain control. Given amoxicillin in case she starts having fevers. Dental referral. Advised to drink plenty of fluids. Pain medications to be taken with food.

## 2013-01-11 ENCOUNTER — Other Ambulatory Visit: Payer: Self-pay

## 2013-01-24 ENCOUNTER — Other Ambulatory Visit: Payer: Self-pay | Admitting: Internal Medicine

## 2013-01-25 ENCOUNTER — Ambulatory Visit (INDEPENDENT_AMBULATORY_CARE_PROVIDER_SITE_OTHER): Payer: No Typology Code available for payment source | Admitting: Internal Medicine

## 2013-01-25 ENCOUNTER — Encounter: Payer: Self-pay | Admitting: Internal Medicine

## 2013-01-25 VITALS — BP 117/62 | HR 84 | Temp 97.5°F | Ht 59.5 in | Wt 107.4 lb

## 2013-01-25 DIAGNOSIS — E119 Type 2 diabetes mellitus without complications: Secondary | ICD-10-CM

## 2013-01-25 DIAGNOSIS — I10 Essential (primary) hypertension: Secondary | ICD-10-CM

## 2013-01-25 DIAGNOSIS — E785 Hyperlipidemia, unspecified: Secondary | ICD-10-CM

## 2013-01-25 DIAGNOSIS — R87612 Low grade squamous intraepithelial lesion on cytologic smear of cervix (LGSIL): Secondary | ICD-10-CM

## 2013-01-25 DIAGNOSIS — M810 Age-related osteoporosis without current pathological fracture: Secondary | ICD-10-CM

## 2013-01-25 DIAGNOSIS — K5900 Constipation, unspecified: Secondary | ICD-10-CM

## 2013-01-25 LAB — BASIC METABOLIC PANEL
BUN: 24 mg/dL — ABNORMAL HIGH (ref 6–23)
Chloride: 100 mEq/L (ref 96–112)
Creat: 1.12 mg/dL — ABNORMAL HIGH (ref 0.50–1.10)

## 2013-01-25 LAB — GLUCOSE, CAPILLARY: Glucose-Capillary: 116 mg/dL — ABNORMAL HIGH (ref 70–99)

## 2013-01-25 LAB — POCT GLYCOSYLATED HEMOGLOBIN (HGB A1C): Hemoglobin A1C: 6

## 2013-01-25 MED ORDER — CALCIUM 1200-1000 MG-UNIT PO CHEW: 1.0000 | CHEWABLE_TABLET | Freq: Every day | ORAL | Status: DC

## 2013-01-25 NOTE — Patient Instructions (Addendum)
General Instructions: Follow up in 4-6 months  Take Tylenol or Ibuprofen for leg pain not to exceed daily dosing Try heat to leg pain when it occurs   Take medications as prescribed.  A new vitamin D and calcium was called into your pharmacy  Please get a repeat DEXA scan  Follow up with OB/GYN for abnormal pap smear  Type 2 Diabetes Mellitus, Adult Type 2 diabetes mellitus, often simply referred to as type 2 diabetes, is a long-lasting (chronic) disease. In type 2 diabetes, the pancreas does not make enough insulin (a hormone), the cells are less responsive to the insulin that is made (insulin resistance), or both. Normally, insulin moves sugars from food into the tissue cells. The tissue cells use the sugars for energy. The lack of insulin or the lack of normal response to insulin causes excess sugars to build up in the blood instead of going into the tissue cells. As a result, high blood sugar (hyperglycemia) develops. The effect of high sugar (glucose) levels can cause many complications. Type 2 diabetes was also previously called adult-onset diabetes but it can occur at any age.  RISK FACTORS  A person is predisposed to developing type 2 diabetes if someone in the family has the disease and also has one or more of the following primary risk factors:  Overweight.  An inactive lifestyle.  A history of consistently eating high-calorie foods. Maintaining a normal weight and regular physical activity can reduce the chance of developing type 2 diabetes. SYMPTOMS  A person with type 2 diabetes may not show symptoms initially. The symptoms of type 2 diabetes appear slowly. The symptoms include:  Increased thirst (polydipsia).  Increased urination (polyuria).  Increased urination during the night (nocturia).  Weight loss. This weight loss may be rapid.  Frequent, recurring infections.  Tiredness (fatigue).  Weakness.  Vision changes, such as blurred vision.  Fruity smell to  your breath.  Abdominal pain.  Nausea or vomiting.  Cuts or bruises which are slow to heal.  Tingling or numbness in the hands or feet. DIAGNOSIS Type 2 diabetes is frequently not diagnosed until complications of diabetes are present. Type 2 diabetes is diagnosed when symptoms or complications are present and when blood glucose levels are increased. Your blood glucose level may be checked by one or more of the following blood tests:  A fasting blood glucose test. You will not be allowed to eat for at least 8 hours before a blood sample is taken.  A random blood glucose test. Your blood glucose is checked at any time of the day regardless of when you ate.  A hemoglobin A1c blood glucose test. A hemoglobin A1c test provides information about blood glucose control over the previous 3 months.  An oral glucose tolerance test (OGTT). Your blood glucose is measured after you have not eaten (fasted) for 2 hours and then after you drink a glucose-containing beverage. TREATMENT   You may need to take insulin or diabetes medicine daily to keep blood glucose levels in the desired range.  You will need to match insulin dosing with exercise and healthy food choices. The treatment goal is to maintain the before meal blood sugar (preprandial glucose) level at 70 130 mg/dL. HOME CARE INSTRUCTIONS   Have your hemoglobin A1c level checked twice a year.  Perform daily blood glucose monitoring as directed by your caregiver.  Monitor urine ketones when you are ill and as directed by your caregiver.  Take your diabetes medicine or insulin as  directed by your caregiver to maintain your blood glucose levels in the desired range.  Never run out of diabetes medicine or insulin. It is needed every day.  Adjust insulin based on your intake of carbohydrates. Carbohydrates can raise blood glucose levels but need to be included in your diet. Carbohydrates provide vitamins, minerals, and fiber which are an  essential part of a healthy diet. Carbohydrates are found in fruits, vegetables, whole grains, dairy products, legumes, and foods containing added sugars.    Eat healthy foods. Alternate 3 meals with 3 snacks.  Lose weight if overweight.  Carry a medical alert card or wear your medical alert jewelry.  Carry a 15 gram carbohydrate snack with you at all times to treat low blood glucose (hypoglycemia). Some examples of 15 gram carbohydrate snacks include:  Glucose tablets, 3 or 4   Glucose gel, 15 gram tube  Raisins, 2 tablespoons (24 grams)  Jelly beans, 6  Animal crackers, 8  Regular pop, 4 ounces (120 mL)  Gummy treats, 9  Recognize hypoglycemia. Hypoglycemia occurs with blood glucose levels of 70 mg/dL and below. The risk for hypoglycemia increases when fasting or skipping meals, during or after intense exercise, and during sleep. Hypoglycemia symptoms can include:  Tremors or shakes.  Decreased ability to concentrate.  Sweating.  Increased heart rate.  Headache.  Dry mouth.  Hunger.  Irritability.  Anxiety.  Restless sleep.  Altered speech or coordination.  Confusion.  Treat hypoglycemia promptly. If you are alert and able to safely swallow, follow the 15:15 rule:  Take 15 20 grams of rapid-acting glucose or carbohydrate. Rapid-acting options include glucose gel, glucose tablets, or 4 ounces (120 mL) of fruit juice, regular soda, or low fat milk.  Check your blood glucose level 15 minutes after taking the glucose.  Take 15 20 grams more of glucose if the repeat blood glucose level is still 70 mg/dL or below.  Eat a meal or snack within 1 hour once blood glucose levels return to normal.    Be alert to polyuria and polydipsia which are early signs of hyperglycemia. An early awareness of hyperglycemia allows for prompt treatment. Treat hyperglycemia as directed by your caregiver.  Engage in at least 150 minutes of moderate-intensity physical activity  a week, spread over at least 3 days of the week or as directed by your caregiver. In addition, you should engage in resistance exercise at least 2 times a week or as directed by your caregiver.  Adjust your medicine and food intake as needed if you start a new exercise or sport.  Follow your sick day plan at any time you are unable to eat or drink as usual.  Avoid tobacco use.  Limit alcohol intake to no more than 1 drink per day for nonpregnant women and 2 drinks per day for men. You should drink alcohol only when you are also eating food. Talk with your caregiver whether alcohol is safe for you. Tell your caregiver if you drink alcohol several times a week.  Follow up with your caregiver regularly.  Schedule an eye exam soon after the diagnosis of type 2 diabetes and then annually.  Perform daily skin and foot care. Examine your skin and feet daily for cuts, bruises, redness, nail problems, bleeding, blisters, or sores. A foot exam by a caregiver should be done annually.  Brush your teeth and gums at least twice a day and floss at least once a day. Follow up with your dentist regularly.  Share your  diabetes management plan with your workplace or school.  Stay up-to-date with immunizations.  Learn to manage stress.  Obtain ongoing diabetes education and support as needed.  Participate in, or seek rehabilitation as needed to maintain or improve independence and quality of life. Request a physical or occupational therapy referral if you are having foot or hand numbness or difficulties with grooming, dressing, eating, or physical activity. SEEK MEDICAL CARE IF:   You are unable to eat food or drink fluids for more than 6 hours.  You have nausea and vomiting for more than 6 hours.  Your blood glucose level is over 240 mg/dL.  There is a change in mental status.  You develop an additional serious illness.  You have diarrhea for more than 6 hours.  You have been sick or have had  a fever for a couple of days and are not getting better.  You have pain during any physical activity.  SEEK IMMEDIATE MEDICAL CARE IF:  You have difficulty breathing.  You have moderate to large ketone levels. MAKE SURE YOU:  Understand these instructions.  Will watch your condition.  Will get help right away if you are not doing well or get worse. Document Released: 06/21/2005 Document Revised: 03/15/2012 Document Reviewed: 01/18/2012 [facility] Patient Information 2014 [place], [place].  Hypertension As your heart beats, it forces blood through your arteries. This force is your blood pressure. If the pressure is too high, it is called hypertension (HTN) or high blood pressure. HTN is dangerous because you may have it and not know it. High blood pressure may mean that your heart has to work harder to pump blood. Your arteries may be narrow or stiff. The extra work puts you at risk for heart disease, stroke, and other problems.  Blood pressure consists of two numbers, a higher number over a lower, 110/72, for example. It is stated as "110 over 72." The ideal is below 120 for the top number (systolic) and under 80 for the bottom (diastolic). Write down your blood pressure today. You should pay close attention to your blood pressure if you have certain conditions such as:  Heart failure.  Prior heart attack.  Diabetes  Chronic kidney disease.  Prior stroke.  Multiple risk factors for heart disease. To see if you have HTN, your blood pressure should be measured while you are seated with your arm held at the level of the heart. It should be measured at least twice. A one-time elevated blood pressure reading (especially in the Emergency Department) does not mean that you need treatment. There may be conditions in which the blood pressure is different between your right and left arms. It is important to see your caregiver soon for a recheck. Most people have essential hypertension which  means that there is not a specific cause. This type of high blood pressure may be lowered by changing lifestyle factors such as:  Stress.  Smoking.  Lack of exercise.  Excessive weight.  Drug/tobacco/alcohol use.  Eating less salt. Most people do not have symptoms from high blood pressure until it has caused damage to the body. Effective treatment can often prevent, delay or reduce that damage. TREATMENT  When a cause has been identified, treatment for high blood pressure is directed at the cause. There are a large number of medications to treat HTN. These fall into several categories, and your caregiver will help you select the medicines that are best for you. Medications may have side effects. You should review side effects  with your caregiver. If your blood pressure stays high after you have made lifestyle changes or started on medicines,   Your medication(s) may need to be changed.  Other problems may need to be addressed.  Be certain you understand your prescriptions, and know how and when to take your medicine.  Be sure to follow up with your caregiver within the time frame advised (usually within two weeks) to have your blood pressure rechecked and to review your medications.  If you are taking more than one medicine to lower your blood pressure, make sure you know how and at what times they should be taken. Taking two medicines at the same time can result in blood pressure that is too low. SEEK IMMEDIATE MEDICAL CARE IF:  You develop a severe headache, blurred or changing vision, or confusion.  You have unusual weakness or numbness, or a faint feeling.  You have severe chest or abdominal pain, vomiting, or breathing problems. MAKE SURE YOU:   Understand these instructions.  Will watch your condition.  Will get help right away if you are not doing well or get worse. Document Released: 06/21/2005 Document Revised: 09/13/2011 Document Reviewed: 02/09/2008 [facility]  Patient Information 2014 [place], [place].    Treatment Goals:  Goals (1 Years of Data) as of 01/25/13         As of Today 09/25/12 05/26/12 05/10/12 04/10/12     Blood Pressure    . Blood Pressure < 140/90  117/62 136/70 130/69 143/71 189/81     Result Component    . HEMOGLOBIN A1C < 7.0  6.0 6.3   5.9    . LDL CALC < 100            Progress Toward Treatment Goals:  Treatment Goal 01/25/2013  Hemoglobin A1C at goal  Blood pressure at goal    Self Care Goals & Plans:  Self Care Goal 01/25/2013  Manage my medications take my medicines as prescribed; bring my medications to every visit; refill my medications on time  Monitor my health keep track of my blood pressure; check my feet daily  Eat healthy foods drink diet soda or water instead of juice or soda; eat more vegetables; eat foods that are low in salt; eat baked foods instead of fried foods; eat fruit for snacks and desserts; eat smaller portions  Meeting treatment goals maintain the current self-care plan       Care Management & Community Referrals:  Referral 01/25/2013  Referrals made for care management support none needed  Referrals made to community resources none

## 2013-01-26 ENCOUNTER — Encounter: Payer: Self-pay | Admitting: Internal Medicine

## 2013-01-26 DIAGNOSIS — K5900 Constipation, unspecified: Secondary | ICD-10-CM | POA: Insufficient documentation

## 2013-01-26 NOTE — Assessment & Plan Note (Signed)
BP Readings from Last 3 Encounters:  01/25/13 117/62  09/25/12 136/70  05/26/12 130/69    Lab Results  Component Value Date   NA 134* 01/25/2013   K 4.3 01/25/2013   CREATININE 1.12* 01/25/2013    Assessment: Blood pressure control: controlled Progress toward BP goal:  at goal Comments: Creatinine slightly elevated  Plan: Medications:  continue current medications Educational resources provided: brochure;handout Self management tools provided: home blood pressure logbook Other plans: will encourage oral hydration and f/u BMET in 4-6 months at follow up

## 2013-01-26 NOTE — Progress Notes (Signed)
  Subjective:    Patient ID: Connie Powell, female    DOB: June 06, 1941, 72 y.o.   MRN: [id number]  HPI Comments: 72 y.o women from [place] who does not speak English well.  She is with her grand daughter and her grand daughters fiance.  She has PMH ASCUS pap (05/2012), DM 2 (HA1C 6.0 cbg 116), HTN (BP 117/62), HLD (ldl 85 02/2011), osteoporosis via DEXA 2009, abnormal dentition (following with dentist and getting teeth pulled).  She presents for follow up.  She has appt to see the eye doctor 01/31/13.    She has intermittent 5/10 leg pain on her right leg (thigh) on the side and back of her right leg (thigh area) which radiates to her lower leg.  She denies back pain.  She notices the pain when she sleeps and pain is worse at night.  Walking helps her leg pain and she is able to walk.  She has not tried any medication.  She does not have the pain currently.    FH: denies      Review of Systems  Respiratory: Negative for shortness of breath.   Cardiovascular: Negative for chest pain.  Gastrointestinal: Positive for constipation. Negative for abdominal pain.       Now bowel movement in 3 days   Neurological:       Denies falls        Objective:   Physical Exam  Nursing note and vitals reviewed. Constitutional: She is oriented to person, place, and time. Vital signs are normal. She appears well-developed and well-nourished. She is cooperative. No distress.  HENT:  Head: Normocephalic and atraumatic.  Mouth/Throat: Oropharynx is clear and moist and mucous membranes are normal. Abnormal dentition. No oropharyngeal exudate.  Eyes: Conjunctivae are normal. Pupils are equal, round, and reactive to light. Right eye exhibits no discharge. Left eye exhibits no discharge. No scleral icterus.  Cardiovascular: Normal rate, regular rhythm, S1 normal, S2 normal and normal heart sounds.   No murmur heard. Pulmonary/Chest: Effort normal and breath sounds normal. No respiratory distress. She has no wheezes.    Abdominal: Soft. Bowel sounds are normal. There is no tenderness.  Musculoskeletal:       Legs: Gait is normal  Neurological: She is alert and oriented to person, place, and time. Gait normal.  Skin: Skin is warm, dry and intact. No rash noted. She is not diaphoretic.  Psychiatric: She has a normal mood and affect. Her speech is normal and behavior is normal. Judgment and thought content normal. Cognition and memory are normal.          Assessment & Plan:  Follow up in 4-6 months

## 2013-01-26 NOTE — Assessment & Plan Note (Signed)
H/o ASCUS pap 05/2012  Advised to revisit OB/GYN for follow up and further management

## 2013-01-26 NOTE — Assessment & Plan Note (Signed)
Lab Results  Component Value Date   HGBA1C 6.0 01/25/2013   HGBA1C 6.3 09/25/2012   HGBA1C 5.9 04/10/2012     Assessment: Diabetes control: good control (HgbA1C at goal) Progress toward A1C goal:  at goal Comments: none  Plan: Medications:  continue current medications Home glucose monitoring:N/A Instruction/counseling given: reminded to get eye exam Educational resources provided: brochure;handout Self management tools provided: none Other plans: none

## 2013-01-26 NOTE — Assessment & Plan Note (Signed)
Lipid Panel     Component Value Date/Time   CHOL 144 03/03/2011 1103   TRIG 75 03/03/2011 1103   HDL 44 03/03/2011 1103   CHOLHDL 3.3 03/03/2011 1103   VLDL 15 03/03/2011 1103   LDLCALC 85 03/03/2011 1103   LDL at goal will repeat LDL at follow up

## 2013-01-26 NOTE — Assessment & Plan Note (Signed)
Repeat DEXA  Changed calcium 500-vitamin d 200 to ca 1200 and vitamin D 800 IU Will follow DEXA results

## 2013-01-26 NOTE — Assessment & Plan Note (Signed)
Discussed prune juice for relief

## 2013-01-27 ENCOUNTER — Encounter: Payer: Self-pay | Admitting: Internal Medicine

## 2013-01-29 ENCOUNTER — Ambulatory Visit (HOSPITAL_COMMUNITY)
Admission: RE | Admit: 2013-01-29 | Discharge: 2013-01-29 | Disposition: A | Discharge status: Home / Self Care | Payer: No Typology Code available for payment source | Source: Ambulatory Visit | Attending: Internal Medicine | Admitting: Internal Medicine

## 2013-01-29 DIAGNOSIS — M810 Age-related osteoporosis without current pathological fracture: Secondary | ICD-10-CM

## 2013-01-29 DIAGNOSIS — Z780 Asymptomatic menopausal state: Secondary | ICD-10-CM | POA: Insufficient documentation

## 2013-01-29 DIAGNOSIS — Z13820 Encounter for screening for osteoporosis: Secondary | ICD-10-CM | POA: Insufficient documentation

## 2013-01-29 NOTE — Progress Notes (Signed)
Case discussed with Dr. McLean at the time of the visit.  We reviewed the resident's history and exam and pertinent patient test results.  I agree with the assessment, diagnosis, and plan of care documented in the resident's note.     

## 2013-02-06 ENCOUNTER — Encounter: Payer: Self-pay | Admitting: Internal Medicine

## 2013-02-27 ENCOUNTER — Encounter: Payer: Self-pay | Admitting: Family Medicine

## 2013-03-26 ENCOUNTER — Encounter: Payer: Self-pay | Admitting: Internal Medicine

## 2013-04-11 ENCOUNTER — Ambulatory Visit (INDEPENDENT_AMBULATORY_CARE_PROVIDER_SITE_OTHER): Payer: Self-pay | Admitting: Family Medicine

## 2013-04-11 ENCOUNTER — Encounter: Payer: Self-pay | Admitting: Family Medicine

## 2013-04-11 VITALS — BP 157/75 | HR 81 | Wt 109.3 lb

## 2013-04-11 DIAGNOSIS — R6889 Other general symptoms and signs: Secondary | ICD-10-CM

## 2013-04-11 DIAGNOSIS — R229 Localized swelling, mass and lump, unspecified: Secondary | ICD-10-CM

## 2013-04-11 DIAGNOSIS — R2231 Localized swelling, mass and lump, right upper limb: Secondary | ICD-10-CM

## 2013-04-11 DIAGNOSIS — Z23 Encounter for immunization: Secondary | ICD-10-CM

## 2013-04-11 DIAGNOSIS — Z01419 Encounter for gynecological examination (general) (routine) without abnormal findings: Secondary | ICD-10-CM

## 2013-04-11 NOTE — Progress Notes (Signed)
Subjective:     Connie Powell is a 72 y.o. woman who comes in today for a  pap smear only and breast exam. Her most recent annual exam was in 2013.  Her most recent Pap smear was on 2013 and showed low-grade squamous intraepithelial neoplasia (LGSIL - encompassing HPV,mild dysplasia,CIN I). She was referred today by her PCP for colposcopy but given it has been over 1 year since last PAP smear was obtained.    Past Medical History  Diagnosis Date  . Diabetes mellitus   . Hyperlipidemia   . Hypertension   . 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 Osteoporosis     Dexa scan 4/09, starting alendronate 6/09; DEXA 01/29/2013 with T score ranges -2.5 to -3.4 rec repeat DEXA in 1 yr  . 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 Chronic cough     with negative chest CT  . 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 Marital Status: Widowed    Spouse Name: N/A    Number of Children: N/A  . Years of Education: N/A   Occupational History  . Not on file.   Social History Main Topics  . Smoking status: Never Smoker   . Smokeless tobacco: Never Used  . Alcohol Use: No  . Drug Use: No  . Sexual Activity: No   Other Topics Concern  . Not on file   Social History Narrative   Moved here from [place] in 2004.Lives with her daughter.   Past Surgical History  Procedure Laterality Date  . Cholecystectomy    . Cataract extraction  10/2011, 04/2012    both eyes   History reviewed. No pertinent family history. Current Outpatient Prescriptions on File Prior to Visit  Medication Sig Dispense Refill  . Calcium 1200-1000 MG-UNIT CHEW Chew 1 capsule by mouth daily.  30 each  5  . lisinopril-hydrochlorothiazide (PRINZIDE) 10-12.5 MG per tablet Take 1 tablet by mouth daily.  90 tablet  3  . metFORMIN (GLUCOPHAGE) 1000 MG tablet Take 1 tablet (1,000 mg total) by mouth 2 (two) times daily with a meal.  60  tablet  5  . naproxen (NAPROSYN) 500 MG tablet Take 1 tablet (500 mg total) by mouth 2 (two) times daily with a meal.  20 tablet  0   No current facility-administered medications on file prior to visit.   No Known Allergies   Review of Systems A comprehensive review of systems was negative.   Objective:   Filed Vitals:   04/11/13 1433  BP: 157/75  Pulse: 81  Weight: 109 lb 4.8 oz (49.578 kg)   BP 157/75  Pulse 81  Wt 109 lb 4.8 oz (49.578 kg)  BMI 21.72 kg/m2 GENERAL: Well-developed, well-nourished female in no acute distress.  HEENT: Normocephalic, atraumatic. Sclerae anicteric.  NECK: Supple. Normal thyroid.  LUNGS: Clear to auscultation bilaterally.  HEART: Regular rate and rhythm. BREASTS: Symmetric in size. No masses, skin changes, nipple drainage, right sided axillary adenopathy with a single firm 6cm nodule that is mobile. Nontender. No skin changes.  ABDOMEN: Soft, nontender, nondistended. No organomegaly. PELVIC: Normal external female genitalia. Vagina is pink and rugated.  Normal discharge. Normal cervix contour. Pap smear obtained. Uterus is normal in size. No adnexal mass or tenderness.  EXTREMITIES: No cyanosis, clubbing,  or edema, 2+ distal pulses.   Assessment:     Axillary mass, right  ASCUS on Pap smear  Routine gynecological examination - Plan: Cytology - PAP   .   Plan:   1) routine GYN exam - PAP and pelvic done today - breast exam done - discussed good exercise and healthy eating  - recommend f/u with PCP for BP   2) axillary mass - concerning for possible lymphoma vs. Reactive adenopathy - no weight loss, nipple drainage or skin changes - pt has had it since 2007 and slowly  - referred her to [facility] for diagnostic mammogram and right axillary [place] with possible FNA  3) flu shot given also

## 2013-04-11 NOTE — Patient Instructions (Signed)
1) I will let you know the results of your pap smear 2) we will refer you for mammography and evaluation of the lump in your armpit

## 2013-04-13 ENCOUNTER — Other Ambulatory Visit: Payer: Self-pay | Admitting: *Deleted

## 2013-04-13 ENCOUNTER — Other Ambulatory Visit: Payer: Self-pay | Admitting: Internal Medicine

## 2013-04-13 DIAGNOSIS — E119 Type 2 diabetes mellitus without complications: Secondary | ICD-10-CM

## 2013-04-13 DIAGNOSIS — I10 Essential (primary) hypertension: Secondary | ICD-10-CM

## 2013-04-13 MED ORDER — LISINOPRIL-HYDROCHLOROTHIAZIDE 10-12.5 MG PO TABS: 1.0000 | ORAL_TABLET | Freq: Every day | ORAL | Status: DC

## 2013-04-13 MED ORDER — METFORMIN HCL 1000 MG PO TABS: 1000.0000 mg | ORAL_TABLET | Freq: Two times a day (BID) | ORAL | Status: DC

## 2013-04-13 NOTE — Telephone Encounter (Signed)
Also request for amlodipine 10 mg.  Is pt still on this med???

## 2013-04-18 NOTE — Telephone Encounter (Signed)
Rx called in 

## 2013-05-03 ENCOUNTER — Telehealth: Payer: Self-pay

## 2013-05-03 NOTE — Telephone Encounter (Signed)
Spoke with Martie Lee from [facility] and was informed that pt does not quailfy for BCCCP due to pt being 72 years old and the BCCCP program stops at 77 due to pt then being able to [place] for Medicare.  Called pt with [employer] interpreter # [phone] and I informed pt that she does not qualify for the BCCCP program and that we advise her to please come in the office to pick up financial assistance papers and get those submitted asap so that she can get her axilla mass and abnormal pap smear procedure taking care of.  Pt stated understanding and that when her son comes home from school then she will come to the office to pick up financial assistance paperwork.

## 2013-05-17 NOTE — Telephone Encounter (Addendum)
Erie Noe gave pt her appt for her colpo scheduled for 05/18/13 @ 0915am for colpo.  [facility] and asked what they had for options for patients who do not have insurance.  I was informed that pt can receive 60% off of time of service or can do a payment plan with a 25% discount the payment will divided into three equal payments.  Breast US cost is 102.20, bilateral dignostic mammogram is 130.40, and one is 105.60.  Interpreter disconnected.   Called pt again with [facility] interpreter # [phone] and verified with her that she will be here for her appt on tomorrow with 05/18/13 @ 0915 and we take care things in her appt concerning her mass on axilla.  Pt stated understanding and stated that she will be here early in the am.

## 2013-05-18 ENCOUNTER — Other Ambulatory Visit (HOSPITAL_COMMUNITY)
Admission: RE | Admit: 2013-05-18 | Discharge: 2013-05-18 | Disposition: A | Discharge status: Home / Self Care | Payer: Self-pay | Source: Ambulatory Visit | Attending: Family Medicine | Admitting: Family Medicine

## 2013-05-18 ENCOUNTER — Encounter: Payer: Self-pay | Admitting: Family Medicine

## 2013-05-18 ENCOUNTER — Ambulatory Visit (INDEPENDENT_AMBULATORY_CARE_PROVIDER_SITE_OTHER): Payer: Self-pay | Admitting: Family Medicine

## 2013-05-18 VITALS — BP 140/78 | HR 70 | Temp 97.5°F | Wt 108.3 lb

## 2013-05-18 DIAGNOSIS — R229 Localized swelling, mass and lump, unspecified: Secondary | ICD-10-CM

## 2013-05-18 DIAGNOSIS — IMO0002 Reserved for concepts with insufficient information to code with codable children: Secondary | ICD-10-CM

## 2013-05-18 DIAGNOSIS — R2231 Localized swelling, mass and lump, right upper limb: Secondary | ICD-10-CM

## 2013-05-18 DIAGNOSIS — R87612 Low grade squamous intraepithelial lesion on cytologic smear of cervix (LGSIL): Secondary | ICD-10-CM

## 2013-05-18 DIAGNOSIS — R6889 Other general symptoms and signs: Secondary | ICD-10-CM

## 2013-05-18 NOTE — Progress Notes (Signed)
GYNECOLOGY CLINIC PROCEDURE NOTE  72 y.o. [id number] here for colposcopy for low-grade squamous intraepithelial neoplasia (LGSIL - encompassing HPV,mild dysplasia,CIN I) pap smear on 04/11/13. Pt has previously had several abnormal pap smears with ASCUS but never has had a colposcopy.  Discussed role for HPV in cervical dysplasia, need for surveillance.  Patient given informed consent, signed copy in the chart, time out was performed.  Placed in lithotomy position. Cervix viewed with speculum and colposcope after application of acetic acid.   Colposcopy adequate? Yes but difficult to visualize the squamocolumnar junction due to atrophy given her age and postmenopausal status.   no visible lesions, no mosaicism, no punctation and no abnormal vasculature; no biopsies obtained.  ECC specimen obtained. All specimens were labelled and sent to pathology.  Patient was given post procedure instructions.  Will follow up pathology and manage accordingly.  Routine preventative health maintenance measures emphasized.  Pt also with axillary mass- see last visit note. Initially referred to [facility] but now being done with [place] so diagnostic mammogram and [place] ordered.

## 2013-05-18 NOTE — Patient Instructions (Signed)
Colposcopy Care After Colposcopy is a procedure in which a special tool is used to magnify the surface of the cervix. A tissue sample (biopsy) may also be taken. This sample will be looked at for cervical cancer or other problems. After the test:  You may have some cramping.  Lie down for a few minutes if you feel lightheaded.   You may have some bleeding which should stop in a few days. HOME CARE  Do not have sex or use tampons for 2 to 3 days or as told.  Only take medicine as told by your doctor.  Continue to take your birth control pills as usual. Finding out the results of your test Ask when your test results will be ready. Make sure you get your test results. GET HELP RIGHT AWAY IF:  You are bleeding a lot or are passing blood clots.  You develop a fever of 102 F (38.9 C) or higher.  You have abnormal vaginal discharge.  You have cramps that do not go away with medicine.  You feel lightheaded, dizzy, or pass out (faint). MAKE SURE YOU:   Understand these instructions.  Will watch your condition.  Will get help right away if you are not doing well or get worse. Document Released: 12/08/2007 Document Revised: 09/13/2011 Document Reviewed: 01/18/2013 Exit[employer] Patient Information 2014 [employer].  

## 2013-05-24 ENCOUNTER — Ambulatory Visit (INDEPENDENT_AMBULATORY_CARE_PROVIDER_SITE_OTHER): Payer: Self-pay | Admitting: Internal Medicine

## 2013-05-24 ENCOUNTER — Encounter: Payer: Self-pay | Admitting: Internal Medicine

## 2013-05-24 ENCOUNTER — Encounter: Payer: Self-pay | Admitting: *Deleted

## 2013-05-24 VITALS — BP 110/61 | HR 65 | Temp 97.0°F | Ht 59.5 in | Wt 109.9 lb

## 2013-05-24 DIAGNOSIS — E119 Type 2 diabetes mellitus without complications: Secondary | ICD-10-CM

## 2013-05-24 DIAGNOSIS — D3610 Benign neoplasm of peripheral nerves and autonomic nervous system, unspecified: Secondary | ICD-10-CM | POA: Insufficient documentation

## 2013-05-24 DIAGNOSIS — I10 Essential (primary) hypertension: Secondary | ICD-10-CM

## 2013-05-24 DIAGNOSIS — R229 Localized swelling, mass and lump, unspecified: Secondary | ICD-10-CM

## 2013-05-24 DIAGNOSIS — M810 Age-related osteoporosis without current pathological fracture: Secondary | ICD-10-CM

## 2013-05-24 DIAGNOSIS — R87612 Low grade squamous intraepithelial lesion on cytologic smear of cervix (LGSIL): Secondary | ICD-10-CM

## 2013-05-24 DIAGNOSIS — R2231 Localized swelling, mass and lump, right upper limb: Secondary | ICD-10-CM

## 2013-05-24 NOTE — Assessment & Plan Note (Signed)
BP Readings from Last 3 Encounters:  05/24/13 110/61  05/18/13 140/78  04/11/13 157/75    Lab Results  Component Value Date   NA 134* 01/25/2013   K 4.3 01/25/2013   CREATININE 1.12* 01/25/2013    Assessment: Blood pressure control: controlled Progress toward BP goal:  at goal Comments: none  Plan: Medications:  continue current medications (ACEI-HCTX 10-12.5 mg daily). No need for norvasc BP is controlled on these two agents  Educational resources provided: other (see comments) Self management tools provided: other (see comments) Other plans: none

## 2013-05-24 NOTE — Assessment & Plan Note (Signed)
Confirmed with DEXA 01/2013  Continue Calcium 1200 mg qd, vitamin D 800 IU Consider Fosamax disc'ed with pt and grandaughter but they want to think about the medication  Given info about fosamax

## 2013-05-24 NOTE — Assessment & Plan Note (Signed)
Lab Results  Component Value Date   HGBA1C 6.1 05/24/2013   HGBA1C 6.0 01/25/2013   HGBA1C 6.3 09/25/2012     Assessment: Diabetes control: good control (HgbA1C at goal) Progress toward A1C goal:  at goal Comments: controlled   Plan: Medications:  continue current medications Metformin 500 daily to bid.  Prob okay to decreased to 500 mg qd  Home glucose monitoring: Frequency: no home glucose monitoring Timing: N/A Instruction/counseling given: reminded to bring medications to each visit Educational resources provided: other (see comments) Self management tools provided: other (see comments) Other plans: none

## 2013-05-24 NOTE — Progress Notes (Signed)
  Subjective:    Patient ID: Connie Powell, female    DOB: 09/16/1940, 72 y.o.   MRN: [id number]  HPI Comments: 72 y.o women from [place] who does not speak English well.  She is with her grand daughter and her grandsons.  She has PMH ASCUS pap (05/2012) and LSIL, DM 2 (HA1C 6.1), HTN (BP 110/61), HLD (ldl 85 02/2011), osteoporosis via DEXA 01/2013, abnormal dentition (following with dentist and getting teeth pulled), arthritis.  She presents for follow up after seeing OB/GYN for LSIL and colposcopy.  She will continue to follow with them.  She denies complaints today.   Also of note she has a mass under her right axilla present x 6 years, not painful, not changing size.  This was noted by OB/GYN and the patient will have right axillary [place] with possible FNA 06/08/13.  She denies wt loss, fever, chills, sweats, masses in her breast b/l.  5-6 cm x 5-6 cm x 1.5 cm approximately   ROS: see HPI           Review of Systems     Objective:   Physical Exam  Nursing note and vitals reviewed. Constitutional: She is oriented to person, place, and time. Vital signs are normal. She appears well-developed and well-nourished. She is cooperative. No distress.  HENT:  Head: Normocephalic and atraumatic.  Mouth/Throat: Oropharynx is clear and moist and mucous membranes are normal. Abnormal dentition. No oropharyngeal exudate.  Eyes: Conjunctivae are normal. Right eye exhibits no discharge. Left eye exhibits no discharge. No scleral icterus.  Cardiovascular: Normal rate, regular rhythm, S1 normal, S2 normal and normal heart sounds.   No murmur heard. Pulmonary/Chest: Effort normal and breath sounds normal. No respiratory distress. She has no wheezes.    No breast masses b/l   Abdominal: Soft. Bowel sounds are normal. There is no tenderness.  Neurological: She is alert and oriented to person, place, and time. Gait normal.  Skin: Skin is warm, dry and intact. No rash noted. She is not diaphoretic.    Psychiatric: She has a normal mood and affect. Her speech is normal and behavior is normal. Judgment and thought content normal. Cognition and memory are normal.          Assessment & Plan:  F/u in 4-6 months sooner if needed will check CBC at that time

## 2013-05-24 NOTE — Assessment & Plan Note (Signed)
LSIL s/p colp with OB/GYN Pt will continue to follow with OB/GYN

## 2013-05-24 NOTE — Patient Instructions (Addendum)
General Instructions: Take Calcium 1200 mg daily and vitamin D 800 IU daily.  Consider taking Fosamax  Follow up with your OB/GYN or female doctor.   Follow up in 4-6 months sooner if needed    Treatment Goals:  Goals (1 Years of Data) as of 05/24/13         As of Today 05/18/13 04/11/13 01/25/13 09/25/12     Blood Pressure    . Blood Pressure < 140/90  110/61 140/78 157/75 117/62 136/70     Result Component    . HEMOGLOBIN A1C < 7.0  6.1   6.0 6.3    . LDL CALC < 100            Progress Toward Treatment Goals:  Treatment Goal 05/24/2013  Hemoglobin A1C at goal  Blood pressure at goal    Self Care Goals & Plans:  Self Care Goal 05/24/2013  Manage my medications take my medicines as prescribed; bring my medications to every visit  Monitor my health keep track of my blood glucose; bring my glucose meter and log to each visit; keep track of my blood pressure  Eat healthy foods -  Meeting treatment goals maintain the current self-care plan    Home Blood Glucose Monitoring 05/24/2013  Check my blood sugar no home glucose monitoring  When to check my blood sugar N/A     Care Management & Community Referrals:  Referral 05/24/2013  Referrals made for care management support none needed  Referrals made to community resources none        Alendronate tablets-Bone medicine What is this medicine? ALENDRONATE (a LEN droe nate) slows calcium loss from bones. It helps to make normal healthy bone and to slow bone loss in people with Paget's disease and osteoporosis. It may be used in others at risk for bone loss. This medicine may be used for other purposes; ask your health care provider or pharmacist if you have questions. COMMON BRAND NAME(S): Fosamax What should I tell my health care provider before I take this medicine? They need to know if you have any of these conditions: -dental disease -esophagus, stomach, or intestine problems, like acid reflux or GERD -kidney  disease -low blood calcium -low vitamin D -problems sitting or standing 30 minutes -trouble swallowing -an unusual or allergic reaction to alendronate, other medicines, foods, dyes, or preservatives -pregnant or trying to get pregnant -breast-feeding How should I use this medicine? You must take this medicine exactly as directed or you will lower the amount of the medicine you absorb into your body or you may cause yourself harm. Take this medicine by mouth first thing in the morning, after you are up for the day. Do not eat or drink anything before you take your medicine. Swallow the tablet with a full glass (6 to 8 fluid ounces) of plain water. Do not take this medicine with any other drink. Do not chew or crush the tablet. After taking this medicine, do not eat breakfast, drink, or take any medicines or vitamins for at least 30 minutes. Sit or stand up for at least 30 minutes after you take this medicine; do not lie down. Do not take your medicine more often than directed. Talk to your pediatrician regarding the use of this medicine in children. Special care may be needed. Overdosage: If you think you have taken too much of this medicine contact a poison control center or emergency room at once. NOTE: This medicine is only for you. Do not  share this medicine with others. What if I miss a dose? If you miss a dose, do not take it later in the day. Continue your normal schedule starting the next morning. Do not take double or extra doses. What may interact with this medicine? -aluminum hydroxide -antacids -aspirin -calcium supplements -drugs for inflammation like ibuprofen, naproxen, and others -iron supplements -magnesium supplements -vitamins with minerals This list may not describe all possible interactions. Give your health care provider a list of all the medicines, herbs, non-prescription drugs, or dietary supplements you use. Also tell them if you smoke, drink alcohol, or use illegal  drugs. Some items may interact with your medicine. What should I watch for while using this medicine? Visit your doctor or health care professional for regular checks ups. It may be some time before you see benefit from this medicine. Do not stop taking your medicine except on your doctor's advice. Your doctor or health care professional may order blood tests and other tests to see how you are doing. You should make sure you get enough calcium and vitamin D while you are taking this medicine, unless your doctor tells you not to. Discuss the foods you eat and the vitamins you take with your health care professional. Some people who take this medicine have severe bone, joint, and/or muscle pain. This medicine may also increase your risk for a broken thigh bone. Tell your doctor right away if you have pain in your upper leg or groin. Tell your doctor if you have any pain that does not go away or that gets worse. This medicine can make you more sensitive to the sun. If you get a rash while taking this medicine, sunlight may cause the rash to get worse. Keep out of the sun. If you cannot avoid being in the sun, wear protective clothing and use sunscreen. Do not use sun lamps or tanning beds/booths. What side effects may I notice from receiving this medicine? Side effects that you should report to your doctor or health care professional as soon as possible: -allergic reactions like skin rash, itching or hives, swelling of the face, lips, or tongue -black or tarry stools -bone, muscle or joint pain -changes in vision -chest pain -heartburn or stomach pain -jaw pain, especially after dental work -pain or trouble when swallowing -redness, blistering, peeling or loosening of the skin, including inside the mouth Side effects that usually do not require medical attention (report to your doctor or health care professional if they continue or are bothersome): -changes in taste -diarrhea or constipation -eye  pain or itching -headache -nausea or vomiting -stomach gas or fullness This list may not describe all possible side effects. Call your doctor for medical advice about side effects. You may report side effects to FDA at 1-800-FDA-1088. Where should I keep my medicine? Keep out of the reach of children. Store at room temperature of 15 and 30 degrees C (59 and 86 degrees F). Throw away any unused medicine after the expiration date. NOTE: This sheet is a summary. It may not cover all possible information. If you have questions about this medicine, talk to your doctor, pharmacist, or health care provider.  2014, Elsevier/Gold Standard. (2010-12-18 08:56:09)   Osteoporosis Throughout your life, your body breaks down old bone and replaces it with new bone. As you get older, your body does not replace bone as quickly as it breaks it down. By the age of 30 years, most people begin to gradually lose bone because of the  imbalance between bone loss and replacement. Some people lose more bone than others. Bone loss beyond a specified normal degree is considered osteoporosis.  Osteoporosis affects the strength and durability of your bones. The inside of the ends of your bones and your flat bones, like the bones of your pelvis, look like honeycomb, filled with tiny open spaces. As bone loss occurs, your bones become less dense. This means that the open spaces inside your bones become bigger and the walls between these spaces become thinner. This makes your bones weaker. Bones of a person with osteoporosis can become so weak that they can break (fracture) during minor accidents, such as a simple fall. CAUSES  The following factors have been associated with the development of osteoporosis:  Smoking.  Drinking more than 2 alcoholic drinks several days per week.  Long-term use of certain medicines:  Corticosteroids.  Chemotherapy medicines.  Thyroid medicines.  Antiepileptic medicines.  Gonadal hormone  suppression medicine.  Immunosuppression medicine.  Being underweight.  Lack of physical activity.  Lack of exposure to the sun. This can lead to vitamin D deficiency.  Certain medical conditions:  Certain inflammatory bowel diseases, such as Crohn disease and ulcerative colitis.  Diabetes.  Hyperthyroidism.  Hyperparathyroidism. RISK FACTORS Anyone can develop osteoporosis. However, the following factors can increase your risk of developing osteoporosis:  Gender Women are at higher risk than men.  Age Being older than 50 years increases your risk.  Ethnicity White and Asian people have an increased risk.  Weight Being extremely underweight can increase your risk of osteoporosis.  Family history of osteoporosis Having a family member who has developed osteoporosis can increase your risk. SYMPTOMS  Usually, people with osteoporosis have no symptoms.  DIAGNOSIS  Signs during a physical exam that may prompt your caregiver to suspect osteoporosis include:  Decreased height. This is usually caused by the compression of the bones that form your spine (vertebrae) because they have weakened and become fractured.  A curving or rounding of the upper back (kyphosis). To confirm signs of osteoporosis, your caregiver may request a procedure that uses 2 low-dose X-ray beams with different levels of energy to measure your bone mineral density (dual-energy X-ray absorptiometry [DXA]). Also, your caregiver may check your level of vitamin D. TREATMENT  The goal of osteoporosis treatment is to strengthen bones in order to decrease the risk of bone fractures. There are different types of medicines available to help achieve this goal. Some of these medicines work by slowing the processes of bone loss. Some medicines work by increasing bone density. Treatment also involves making sure that your levels of calcium and vitamin D are adequate. PREVENTION  There are things you can do to help prevent  osteoporosis. Adequate intake of calcium and vitamin D can help you achieve optimal bone mineral density. Regular exercise can also help, especially resistance and weight-bearing activities. If you smoke, quitting smoking is an important part of osteoporosis prevention. MAKE SURE YOU:  Understand these instructions.  Will watch your condition.  Will get help right away if you are not doing well or get worse. FOR MORE INFORMATION www.[email or web address] and [email or web address] Document Released: 03/31/2005 Document Revised: 10/16/2012 Document Reviewed: 06/05/2011 [facility] Patient Information 2014 [place], [place].  Naproxen and naproxen sodium oral immediate-release tablets What is this medicine? NAPROXEN (na PROX en) is a non-steroidal anti-inflammatory drug (NSAID). It is used to reduce swelling and to treat pain. This medicine may be used for dental pain, headache, or painful monthly periods. It is also  used for painful joint and muscular problems such as arthritis, tendinitis, bursitis, and gout. This medicine may be used for other purposes; ask your health care provider or pharmacist if you have questions. COMMON BRAND NAME(S): Aflaxen, Aleve Arthritis, Aleve, All Day Relief, Anaprox DS, Anaprox, Naprosyn What should I tell my health care provider before I take this medicine? They need to know if you have any of these conditions: -asthma -cigarette smoker -drink more than 3 alcohol containing drinks a day -heart disease or circulation problems such as heart failure or leg edema (fluid retention) -high blood pressure -kidney disease -liver disease -stomach bleeding or ulcers -an unusual or allergic reaction to naproxen, aspirin, other NSAIDs, other medicines, foods, dyes, or preservatives -pregnant or trying to get pregnant -breast-feeding How should I use this medicine? Take this medicine by mouth with a glass of water. Follow the directions on the prescription label. Take it with food if your  stomach gets upset. Try to not lie down for at least 10 minutes after you take it. Take your medicine at regular intervals. Do not take your medicine more often than directed. Long-term, continuous use may increase the risk of heart attack or stroke. A special MedGuide will be given to you by the pharmacist with each prescription and refill. Be sure to read this information carefully each time. Talk to your pediatrician regarding the use of this medicine in children. Special care may be needed. Overdosage: If you think you have taken too much of this medicine contact a poison control center or emergency room at once. NOTE: This medicine is only for you. Do not share this medicine with others. What if I miss a dose? If you miss a dose, take it as soon as you can. If it is almost time for your next dose, take only that dose. Do not take double or extra doses. What may interact with this medicine? -alcohol -aspirin -cidofovir -diuretics -lithium -methotrexate -other drugs for inflammation like ketorolac or prednisone -pemetrexed -probenecid -warfarin This list may not describe all possible interactions. Give your health care provider a list of all the medicines, herbs, non-prescription drugs, or dietary supplements you use. Also tell them if you smoke, drink alcohol, or use illegal drugs. Some items may interact with your medicine. What should I watch for while using this medicine? Tell your doctor or health care professional if your pain does not get better. Talk to your doctor before taking another medicine for pain. Do not treat yourself. This medicine does not prevent heart attack or stroke. In fact, this medicine may increase the chance of a heart attack or stroke. The chance may increase with longer use of this medicine and in people who have heart disease. If you take aspirin to prevent heart attack or stroke, talk with your doctor or health care professional. Do not take other medicines  that contain aspirin, ibuprofen, or naproxen with this medicine. Side effects such as stomach upset, nausea, or ulcers may be more likely to occur. Many medicines available without a prescription should not be taken with this medicine. This medicine can cause ulcers and bleeding in the stomach and intestines at any time during treatment. Do not smoke cigarettes or drink alcohol. These increase irritation to your stomach and can make it more susceptible to damage from this medicine. Ulcers and bleeding can happen without warning symptoms and can cause death. You may get drowsy or dizzy. Do not drive, use machinery, or do anything that needs mental alertness until  you know how this medicine affects you. Do not stand or sit up quickly, especially if you are an older patient. This reduces the risk of dizzy or fainting spells. This medicine can cause you to bleed more easily. Try to avoid damage to your teeth and gums when you brush or floss your teeth. What side effects may I notice from receiving this medicine? Side effects that you should report to your doctor or health care professional as soon as possible: -black or bloody stools, blood in the urine or vomit -blurred vision -chest pain -difficulty breathing or wheezing -nausea or vomiting -severe stomach pain -skin rash, skin redness, blistering or peeling skin, hives, or itching -slurred speech or weakness on one side of the body -swelling of eyelids, throat, lips -unexplained weight gain or swelling -unusually weak or tired -yellowing of eyes or skin Side effects that usually do not require medical attention (report to your doctor or health care professional if they continue or are bothersome): -constipation -headache -heartburn This list may not describe all possible side effects. Call your doctor for medical advice about side effects. You may report side effects to FDA at 1-800-FDA-1088. Where should I keep my medicine? Keep out of the  reach of children. Store at room temperature between 15 and 30 degrees C (59 and 86 degrees F). Keep container tightly closed. Throw away any unused medicine after the expiration date. NOTE: This sheet is a summary. It may not cover all possible information. If you have questions about this medicine, talk to your doctor, pharmacist, or health care provider.  2014, Elsevier/Gold Standard. (2009-06-23 20:10:16)  Type 2 Diabetes Mellitus, Adult Type 2 diabetes mellitus, often simply referred to as type 2 diabetes, is a long-lasting (chronic) disease. In type 2 diabetes, the pancreas does not make enough insulin (a hormone), the cells are less responsive to the insulin that is made (insulin resistance), or both. Normally, insulin moves sugars from food into the tissue cells. The tissue cells use the sugars for energy. The lack of insulin or the lack of normal response to insulin causes excess sugars to build up in the blood instead of going into the tissue cells. As a result, high blood sugar (hyperglycemia) develops. The effect of high sugar (glucose) levels can cause many complications. Type 2 diabetes was also previously called adult-onset diabetes but it can occur at any age.  RISK FACTORS  A person is predisposed to developing type 2 diabetes if someone in the family has the disease and also has one or more of the following primary risk factors:  Overweight.  An inactive lifestyle.  A history of consistently eating high-calorie foods. Maintaining a normal weight and regular physical activity can reduce the chance of developing type 2 diabetes. SYMPTOMS  A person with type 2 diabetes may not show symptoms initially. The symptoms of type 2 diabetes appear slowly. The symptoms include:  Increased thirst (polydipsia).  Increased urination (polyuria).  Increased urination during the night (nocturia).  Weight loss. This weight loss may be rapid.  Frequent, recurring  infections.  Tiredness (fatigue).  Weakness.  Vision changes, such as blurred vision.  Fruity smell to your breath.  Abdominal pain.  Nausea or vomiting.  Cuts or bruises which are slow to heal.  Tingling or numbness in the hands or feet. DIAGNOSIS Type 2 diabetes is frequently not diagnosed until complications of diabetes are present. Type 2 diabetes is diagnosed when symptoms or complications are present and when blood glucose levels are increased.  Your blood glucose level may be checked by one or more of the following blood tests:  A fasting blood glucose test. You will not be allowed to eat for at least 8 hours before a blood sample is taken.  A random blood glucose test. Your blood glucose is checked at any time of the day regardless of when you ate.  A hemoglobin A1c blood glucose test. A hemoglobin A1c test provides information about blood glucose control over the previous 3 months.  An oral glucose tolerance test (OGTT). Your blood glucose is measured after you have not eaten (fasted) for 2 hours and then after you drink a glucose-containing beverage. TREATMENT   You may need to take insulin or diabetes medicine daily to keep blood glucose levels in the desired range.  You will need to match insulin dosing with exercise and healthy food choices. The treatment goal is to maintain the before meal blood sugar (preprandial glucose) level at 70 130 mg/dL. HOME CARE INSTRUCTIONS   Have your hemoglobin A1c level checked twice a year.  Perform daily blood glucose monitoring as directed by your caregiver.  Monitor urine ketones when you are ill and as directed by your caregiver.  Take your diabetes medicine or insulin as directed by your caregiver to maintain your blood glucose levels in the desired range.  Never run out of diabetes medicine or insulin. It is needed every day.  Adjust insulin based on your intake of carbohydrates. Carbohydrates can raise blood glucose  levels but need to be included in your diet. Carbohydrates provide vitamins, minerals, and fiber which are an essential part of a healthy diet. Carbohydrates are found in fruits, vegetables, whole grains, dairy products, legumes, and foods containing added sugars.    Eat healthy foods. Alternate 3 meals with 3 snacks.  Lose weight if overweight.  Carry a medical alert card or wear your medical alert jewelry.  Carry a 15 gram carbohydrate snack with you at all times to treat low blood glucose (hypoglycemia). Some examples of 15 gram carbohydrate snacks include:  Glucose tablets, 3 or 4   Glucose gel, 15 gram tube  Raisins, 2 tablespoons (24 grams)  Jelly beans, 6  Animal crackers, 8  Regular pop, 4 ounces (120 mL)  Gummy treats, 9  Recognize hypoglycemia. Hypoglycemia occurs with blood glucose levels of 70 mg/dL and below. The risk for hypoglycemia increases when fasting or skipping meals, during or after intense exercise, and during sleep. Hypoglycemia symptoms can include:  Tremors or shakes.  Decreased ability to concentrate.  Sweating.  Increased heart rate.  Headache.  Dry mouth.  Hunger.  Irritability.  Anxiety.  Restless sleep.  Altered speech or coordination.  Confusion.  Treat hypoglycemia promptly. If you are alert and able to safely swallow, follow the 15:15 rule:  Take 15 20 grams of rapid-acting glucose or carbohydrate. Rapid-acting options include glucose gel, glucose tablets, or 4 ounces (120 mL) of fruit juice, regular soda, or low fat milk.  Check your blood glucose level 15 minutes after taking the glucose.  Take 15 20 grams more of glucose if the repeat blood glucose level is still 70 mg/dL or below.  Eat a meal or snack within 1 hour once blood glucose levels return to normal.    Be alert to polyuria and polydipsia which are early signs of hyperglycemia. An early awareness of hyperglycemia allows for prompt treatment. Treat  hyperglycemia as directed by your caregiver.  Engage in at least 150 minutes of moderate-intensity physical  activity a week, spread over at least 3 days of the week or as directed by your caregiver. In addition, you should engage in resistance exercise at least 2 times a week or as directed by your caregiver.  Adjust your medicine and food intake as needed if you start a new exercise or sport.  Follow your sick day plan at any time you are unable to eat or drink as usual.  Avoid tobacco use.  Limit alcohol intake to no more than 1 drink per day for nonpregnant women and 2 drinks per day for men. You should drink alcohol only when you are also eating food. Talk with your caregiver whether alcohol is safe for you. Tell your caregiver if you drink alcohol several times a week.  Follow up with your caregiver regularly.  Schedule an eye exam soon after the diagnosis of type 2 diabetes and then annually.  Perform daily skin and foot care. Examine your skin and feet daily for cuts, bruises, redness, nail problems, bleeding, blisters, or sores. A foot exam by a caregiver should be done annually.  Brush your teeth and gums at least twice a day and floss at least once a day. Follow up with your dentist regularly.  Share your diabetes management plan with your workplace or school.  Stay up-to-date with immunizations.  Learn to manage stress.  Obtain ongoing diabetes education and support as needed.  Participate in, or seek rehabilitation as needed to maintain or improve independence and quality of life. Request a physical or occupational therapy referral if you are having foot or hand numbness or difficulties with grooming, dressing, eating, or physical activity. SEEK MEDICAL CARE IF:   You are unable to eat food or drink fluids for more than 6 hours.  You have nausea and vomiting for more than 6 hours.  Your blood glucose level is over 240 mg/dL.  There is a change in mental  status.  You develop an additional serious illness.  You have diarrhea for more than 6 hours.  You have been sick or have had a fever for a couple of days and are not getting better.  You have pain during any physical activity.  SEEK IMMEDIATE MEDICAL CARE IF:  You have difficulty breathing.  You have moderate to large ketone levels. MAKE SURE YOU:  Understand these instructions.  Will watch your condition.  Will get help right away if you are not doing well or get worse. Document Released: 06/21/2005 Document Revised: 03/15/2012 Document Reviewed: 01/18/2012 [facility] Patient Information 2014 [place], [place].  Hypertension As your heart beats, it forces blood through your arteries. This force is your blood pressure. If the pressure is too high, it is called hypertension (HTN) or high blood pressure. HTN is dangerous because you may have it and not know it. High blood pressure may mean that your heart has to work harder to pump blood. Your arteries may be narrow or stiff. The extra work puts you at risk for heart disease, stroke, and other problems.  Blood pressure consists of two numbers, a higher number over a lower, 110/72, for example. It is stated as "110 over 72." The ideal is below 120 for the top number (systolic) and under 80 for the bottom (diastolic). Write down your blood pressure today. You should pay close attention to your blood pressure if you have certain conditions such as:  Heart failure.  Prior heart attack.  Diabetes  Chronic kidney disease.  Prior stroke.  Multiple risk factors for  heart disease. To see if you have HTN, your blood pressure should be measured while you are seated with your arm held at the level of the heart. It should be measured at least twice. A one-time elevated blood pressure reading (especially in the Emergency Department) does not mean that you need treatment. There may be conditions in which the blood pressure is different between  your right and left arms. It is important to see your caregiver soon for a recheck. Most people have essential hypertension which means that there is not a specific cause. This type of high blood pressure may be lowered by changing lifestyle factors such as:  Stress.  Smoking.  Lack of exercise.  Excessive weight.  Drug/tobacco/alcohol use.  Eating less salt. Most people do not have symptoms from high blood pressure until it has caused damage to the body. Effective treatment can often prevent, delay or reduce that damage. TREATMENT  When a cause has been identified, treatment for high blood pressure is directed at the cause. There are a large number of medications to treat HTN. These fall into several categories, and your caregiver will help you select the medicines that are best for you. Medications may have side effects. You should review side effects with your caregiver. If your blood pressure stays high after you have made lifestyle changes or started on medicines,   Your medication(s) may need to be changed.  Other problems may need to be addressed.  Be certain you understand your prescriptions, and know how and when to take your medicine.  Be sure to follow up with your caregiver within the time frame advised (usually within two weeks) to have your blood pressure rechecked and to review your medications.  If you are taking more than one medicine to lower your blood pressure, make sure you know how and at what times they should be taken. Taking two medicines at the same time can result in blood pressure that is too low. SEEK IMMEDIATE MEDICAL CARE IF:  You develop a severe headache, blurred or changing vision, or confusion.  You have unusual weakness or numbness, or a faint feeling.  You have severe chest or abdominal pain, vomiting, or breathing problems. MAKE SURE YOU:   Understand these instructions.  Will watch your condition.  Will get help right away if you are not  doing well or get worse. Document Released: 06/21/2005 Document Revised: 09/13/2011 Document Reviewed: 02/09/2008 [facility] Patient Information 2014 [place], [place].

## 2013-05-24 NOTE — Assessment & Plan Note (Addendum)
Noted 04/2013 though per pt has been there for 6 years not sx'matic (pain, increasing in size) OB/GYN will w/u with [place] w possible FNA 06/08/13  Consider repeating CBC with diff. Pt has h/o leukopenia

## 2013-05-25 LAB — GLUCOSE, CAPILLARY: Glucose-Capillary: 97 mg/dL (ref 70–99)

## 2013-05-28 NOTE — Progress Notes (Signed)
Case discussed with Dr. McLean at the time of the visit.  We reviewed the resident's history and exam and pertinent patient test results.  I agree with the assessment, diagnosis, and plan of care documented in the resident's note.     

## 2013-06-07 ENCOUNTER — Ambulatory Visit: Payer: Self-pay

## 2013-06-08 ENCOUNTER — Other Ambulatory Visit: Payer: Self-pay

## 2013-12-06 ENCOUNTER — Ambulatory Visit: Payer: No Typology Code available for payment source

## 2013-12-27 ENCOUNTER — Ambulatory Visit: Payer: No Typology Code available for payment source

## 2014-01-10 ENCOUNTER — Encounter: Payer: Self-pay | Admitting: Internal Medicine

## 2014-01-16 ENCOUNTER — Encounter: Payer: Self-pay | Admitting: Internal Medicine

## 2014-01-16 ENCOUNTER — Ambulatory Visit (INDEPENDENT_AMBULATORY_CARE_PROVIDER_SITE_OTHER): Payer: No Typology Code available for payment source | Admitting: Internal Medicine

## 2014-01-16 VITALS — BP 121/69 | HR 66 | Temp 98.2°F | Ht 59.0 in | Wt 110.0 lb

## 2014-01-16 DIAGNOSIS — R2231 Localized swelling, mass and lump, right upper limb: Secondary | ICD-10-CM

## 2014-01-16 DIAGNOSIS — E785 Hyperlipidemia, unspecified: Secondary | ICD-10-CM

## 2014-01-16 DIAGNOSIS — M810 Age-related osteoporosis without current pathological fracture: Secondary | ICD-10-CM

## 2014-01-16 DIAGNOSIS — I10 Essential (primary) hypertension: Secondary | ICD-10-CM

## 2014-01-16 DIAGNOSIS — Z0000 Encounter for general adult medical examination without abnormal findings: Secondary | ICD-10-CM

## 2014-01-16 DIAGNOSIS — R229 Localized swelling, mass and lump, unspecified: Secondary | ICD-10-CM

## 2014-01-16 DIAGNOSIS — E119 Type 2 diabetes mellitus without complications: Secondary | ICD-10-CM

## 2014-01-16 DIAGNOSIS — K5900 Constipation, unspecified: Secondary | ICD-10-CM

## 2014-01-16 LAB — CBC WITH DIFFERENTIAL/PLATELET
Basophils Absolute: 0 10*3/uL (ref 0.0–0.1)
Basophils Relative: 0 % (ref 0–1)
EOS ABS: 0.1 10*3/uL (ref 0.0–0.7)
EOS PCT: 2 % (ref 0–5)
HEMATOCRIT: 34.5 % — AB (ref 36.0–46.0)
HEMOGLOBIN: 11.8 g/dL — AB (ref 12.0–15.0)
LYMPHS ABS: 1.7 10*3/uL (ref 0.7–4.0)
Lymphocytes Relative: 44 % (ref 12–46)
MCH: 30.8 pg (ref 26.0–34.0)
MCHC: 34.2 g/dL (ref 30.0–36.0)
MCV: 90.1 fL (ref 78.0–100.0)
MONO ABS: 0.2 10*3/uL (ref 0.1–1.0)
MONOS PCT: 5 % (ref 3–12)
NEUTROS PCT: 49 % (ref 43–77)
Neutro Abs: 1.9 10*3/uL (ref 1.7–7.7)
Platelets: 167 10*3/uL (ref 150–400)
RBC: 3.83 MIL/uL — AB (ref 3.87–5.11)
RDW: 12.9 % (ref 11.5–15.5)
WBC: 3.8 10*3/uL — ABNORMAL LOW (ref 4.0–10.5)

## 2014-01-16 LAB — LIPID PANEL
Cholesterol: 200 mg/dL (ref 0–200)
HDL: 47 mg/dL (ref >39)
LDL Cholesterol: 135 mg/dL — ABNORMAL HIGH (ref 0–99)
TRIGLYCERIDES: 90 mg/dL (ref <150)
Total CHOL/HDL Ratio: 4.3 Ratio
VLDL: 18 mg/dL (ref 0–40)

## 2014-01-16 LAB — POCT GLYCOSYLATED HEMOGLOBIN (HGB A1C): Hemoglobin A1C: 6.7

## 2014-01-16 LAB — COMPLETE METABOLIC PANEL WITH GFR
ALT: 13 U/L (ref 0–35)
AST: 18 U/L (ref 0–37)
Albumin: 3.7 g/dL (ref 3.5–5.2)
Alkaline Phosphatase: 64 U/L (ref 39–117)
BUN: 22 mg/dL (ref 6–23)
CALCIUM: 9.4 mg/dL (ref 8.4–10.5)
CO2: 28 mEq/L (ref 19–32)
CREATININE: 1.06 mg/dL (ref 0.50–1.10)
Chloride: 101 mEq/L (ref 96–112)
GFR, EST NON AFRICAN AMERICAN: 53 mL/min — AB
GFR, Est African American: 61 mL/min
GLUCOSE: 106 mg/dL — AB (ref 70–99)
Potassium: 4.3 mEq/L (ref 3.5–5.3)
Sodium: 143 mEq/L (ref 135–145)
Total Bilirubin: 0.7 mg/dL (ref 0.2–1.2)
Total Protein: 7.1 g/dL (ref 6.0–8.3)

## 2014-01-16 LAB — GLUCOSE, CAPILLARY: Glucose-Capillary: 100 mg/dL — ABNORMAL HIGH (ref 70–99)

## 2014-01-16 MED ORDER — METFORMIN HCL 500 MG PO TABS: 500.0000 mg | ORAL_TABLET | Freq: Every day | ORAL | Status: DC

## 2014-01-16 MED ORDER — LISINOPRIL 2.5 MG PO TABS: 2.5000 mg | ORAL_TABLET | Freq: Every day | ORAL | Status: DC

## 2014-01-16 MED ORDER — HYDROCHLOROTHIAZIDE 12.5 MG PO CAPS: 12.5000 mg | ORAL_CAPSULE | Freq: Every day | ORAL | Status: DC

## 2014-01-16 MED ORDER — LISINOPRIL-HYDROCHLOROTHIAZIDE 10-12.5 MG PO TABS: 1.0000 | ORAL_TABLET | Freq: Every day | ORAL | Status: DC

## 2014-01-16 MED ORDER — CALCIUM 1200-1000 MG-UNIT PO CHEW: 1.0000 | CHEWABLE_TABLET | Freq: Every day | ORAL | Status: DC

## 2014-01-16 NOTE — Assessment & Plan Note (Signed)
disc'ed increased fiber intake and prune juice prn

## 2014-01-16 NOTE — Patient Instructions (Addendum)
General Instructions: Please take a reduced dose of your blood pressure medication Lisinopril 2.5 mg and Hydrochlorothiazide 12.5 mg  Read information about Fosamax for bone strenth.   Take calcium and vitamin D Get an [place] on the mass in your right arm Follow up in 6 months   Treatment Goals:  Goals (1 Years of Data) as of 01/16/14         As of Today 05/24/13 05/18/13 04/11/13 01/25/13     Blood Pressure    . Blood Pressure < 140/90  121/69 110/61 140/78 157/75 117/62    . Blood Pressure < 150/90  121/69 110/61 140/78 157/75 117/62     Result Component    . HEMOGLOBIN A1C < 7.0  6.7 6.1   6.0    . HEMOGLOBIN A1C < 8.0  6.7 6.1   6.0    . LDL CALC < 100            Progress Toward Treatment Goals:  Treatment Goal 01/16/2014  Hemoglobin A1C at goal  Blood pressure at goal    Self Care Goals & Plans:  Self Care Goal 01/16/2014  Manage my medications take my medicines as prescribed; bring my medications to every visit; refill my medications on time  Monitor my health keep track of my blood glucose; bring my glucose meter and log to each visit  Eat healthy foods drink diet soda or water instead of juice or soda; eat more vegetables; eat foods that are low in salt; eat baked foods instead of fried foods; eat fruit for snacks and desserts  Be physically active find an activity I enjoy  Meeting treatment goals maintain the current self-care plan    Home Blood Glucose Monitoring 01/16/2014  Check my blood sugar no home glucose monitoring  When to check my blood sugar N/A     Care Management & Community Referrals:  Referral 01/16/2014  Referrals made for care management support none needed  Referrals made to community resources none       Alendronate weekly tablets What is this medicine? ALENDRONATE (a LEN droe nate) slows calcium loss from bones. It helps to make healthy bone and to slow bone loss in people with osteoporosis. It may be used to treat Paget's disease. This  medicine may be used for other purposes; ask your health care provider or pharmacist if you have questions. COMMON BRAND NAME(S): Fosamax What should I tell my health care provider before I take this medicine? They need to know if you have any of these conditions: -esophagus, stomach, or intestine problems, like acid-reflux or GERD -dental disease -kidney disease -low blood calcium -low vitamin D -problems swallowing -problems sitting or standing for 30 minutes -an unusual or allergic reaction to alendronate, other medicines, foods, dyes, or preservatives -pregnant or trying to get pregnant -breast-feeding How should I use this medicine? You must take this medicine exactly as directed or you will lower the amount of medicine you absorb into your body or you may cause yourself harm. Take your dose by mouth first thing in the morning, after you are up for the day. Do not eat or drink anything before you take this medicine. Swallow your medicine with a full glass (6 to 8 fluid ounces) of plain water. Do not take this tablet with any other drink. Do not chew or crush the tablet. After taking this medicine, do not eat breakfast, drink, or take any medicines or vitamins for at least 30 minutes. Stand or sit up  for at least 30 minutes after you take this medicine; do not lie down. Take this medicine on the same day every week. Do not take your medicine more often than directed. Talk to your pediatrician regarding the use of this medicine in children. Special care may be needed. Overdosage: If you think you have taken too much of this medicine contact a poison control center or emergency room at once. NOTE: This medicine is only for you. Do not share this medicine with others. What if I miss a dose? If you miss a dose, take the dose on the morning after you remember. Then take your next dose on your regular day of the week. Never take 2 tablets on the same day. Do not take double or extra doses. What  may interact with this medicine? -aluminum hydroxide -antacids -aspirin -calcium supplements -drugs for inflammation like ibuprofen, naproxen, and others -iron supplements -magnesium supplements -vitamins with minerals This list may not describe all possible interactions. Give your health care provider a list of all the medicines, herbs, non-prescription drugs, or dietary supplements you use. Also tell them if you smoke, drink alcohol, or use illegal drugs. Some items may interact with your medicine. What should I watch for while using this medicine? Visit your doctor or health care professional for regular checks ups. It may be some time before you see benefit from this medicine. Do not stop taking your medication except on your doctor's advice. Your doctor or health care professional may order blood tests and other tests to see how you are doing. You should make sure you get enough calcium and vitamin D while you are taking this medicine, unless your doctor tells you not to. Discuss the foods you eat and the vitamins you take with your health care professional. Some people who take this medicine have severe bone, joint, and/or muscle pain. This medicine may also increase your risk for a broken thigh bone. Tell your doctor right away if you have pain in your upper leg or groin. Tell your doctor if you have any pain that does not go away or that gets worse. This medicine can make you more sensitive to the sun. If you get a rash while taking this medicine, sunlight may cause the rash to get worse. Keep out of the sun. If you cannot avoid being in the sun, wear protective clothing and use sunscreen. Do not use sun lamps or tanning beds/booths. What side effects may I notice from receiving this medicine? Side effects that you should report to your doctor or health care professional as soon as possible: -allergic reactions like skin rash, itching or hives, swelling of the face, lips, or tongue -black  or tarry stools -bone, muscle or joint pain -changes in vision -chest pain -heartburn or stomach pain -jaw pain, especially after dental work -pain or trouble when swallowing -redness, blistering, peeling or loosening of the skin, including inside the mouth Side effects that usually do not require medical attention (report to your doctor or health care professional if they continue or are bothersome): -changes in taste -diarrhea or constipation -eye pain or itching -headache -nausea or vomiting -stomach gas or fullness This list may not describe all possible side effects. Call your doctor for medical advice about side effects. You may report side effects to FDA at 1-800-FDA-1088. Where should I keep my medicine? Keep out of the reach of children. Store at room temperature of 15 and 30 degrees C (59 and 86 degrees F). Throw away  any unused medicine after the expiration date. NOTE: This sheet is a summary. It may not cover all possible information. If you have questions about this medicine, talk to your doctor, pharmacist, or health care provider.  2015, Elsevier/Gold Standard. (2011-05-06 09:02:42)

## 2014-01-16 NOTE — Assessment & Plan Note (Signed)
Lipid Panel     Component Value Date/Time   CHOL 144 03/03/2011 1103   TRIG 75 03/03/2011 1103   HDL 44 03/03/2011 1103   CHOLHDL 3.3 03/03/2011 1103   VLDL 15 03/03/2011 1103   LDLCALC 85 03/03/2011 1103   Will check lipid panel today

## 2014-01-16 NOTE — Assessment & Plan Note (Addendum)
Pt only taking Ca. Encouraged to take Calcium 1200 vitamin D 1000 mg qd  Rx refill  Disc'ed Fosamax 70 mg qweek benefits but pt wants to think about. Given info to read

## 2014-01-16 NOTE — Assessment & Plan Note (Signed)
Lab Results  Component Value Date   HGBA1C 6.7 01/16/2014   HGBA1C 6.1 05/24/2013   HGBA1C 6.0 01/25/2013     Assessment: Diabetes control: good control (HgbA1C at goal) Progress toward A1C goal:  at goal Comments: none  Plan: Medications:  continue current medications (Metformin 500 mg qd) Rx refill today  Home glucose monitoring: Frequency: no home glucose monitoring Timing: N/A Instruction/counseling given: no instruction/counseling  Other plans: f/u in 3-6 months, pt already had eye exam will try to get records, foot exam today

## 2014-01-16 NOTE — Progress Notes (Signed)
   Subjective:    Patient ID: Connie Powell, female    DOB: 06-14-1941, 73 y.o.   MRN: [id number]  HPI Comments: 73 y.o PMH LSIL, cataracts, DM 2, HLD, HTN, osteoporosis, right axillary mass, constipation  She presents for annual exam: 1. Right axillary mass per pt has been there > 3years last visit had been there per pt 6-7 years.  Mass is not sx'matic, not painful.  She never followed up with [place] and bx rec by OB/GYN 2. HTN-controlled 121/69 on Lisinopril 10-HCTZ 12.5 mg qd  3. DM 2-HA1C 6.7 today with cbg 100. Pt taking Metformin 500 mg qd  4. Osteoporosis-she wants to think about fosamax and is only taking Calcium not Calcium and vitamin D.    SH: pt is able to still do ADLs HM: foot exam today, lipid panel, per pt saw Eye MD at [facility] on [address] recently.       Review of Systems  Constitutional: Negative for fever and chills.       Denies sweats   Respiratory: Negative for shortness of breath.   Cardiovascular: Negative for chest pain.  Gastrointestinal: Positive for constipation. Negative for abdominal pain.       Objective:   Physical Exam  Nursing note and vitals reviewed. Constitutional: She is oriented to person, place, and time. Vital signs are normal. She appears well-developed and well-nourished. She is cooperative. No distress.  HENT:  Head: Normocephalic and atraumatic.  Mouth/Throat: Oropharynx is clear and moist and mucous membranes are normal. Abnormal dentition. No oropharyngeal exudate.  Eyes: Conjunctivae are normal. Pupils are equal, round, and reactive to light. Right eye exhibits no discharge. Left eye exhibits no discharge. No scleral icterus.  Cardiovascular: Normal rate, regular rhythm, S1 normal, S2 normal and normal heart sounds.   No murmur heard. Pulses:      Dorsalis pedis pulses are 2+ on the right side, and 2+ on the left side.       Posterior tibial pulses are 2+ on the right side, and 2+ on the left side.  No lower ext edema     Pulmonary/Chest: Effort normal and breath sounds normal. No respiratory distress. She has no wheezes.    Abdominal: Soft. Bowel sounds are normal. There is no tenderness.  Lymphadenopathy:       Head (right side): No submental, no submandibular, no tonsillar, no preauricular, no posterior auricular and no occipital adenopathy present.       Head (left side): No submental, no submandibular, no tonsillar, no preauricular, no posterior auricular and no occipital adenopathy present.    She has no cervical adenopathy.    She has no axillary adenopathy.  Neurological: She is alert and oriented to person, place, and time. Gait normal.  Skin: Skin is warm, dry and intact. No rash noted. She is not diaphoretic.  Psychiatric: She has a normal mood and affect. Her speech is normal and behavior is normal. Judgment and thought content normal. Cognition and memory are normal.          Assessment & Plan:  F/u in 6 months Will refer for [place] right axilla

## 2014-01-16 NOTE — Assessment & Plan Note (Signed)
Mammogram due 05/2014

## 2014-01-16 NOTE — Assessment & Plan Note (Addendum)
Firm mass pt has not followed up from 2014 r/o malinancy Will do [place] axilla then based on results likely refer to interventional radiology for biospy -7/17 addedum spoke with Dr. Kathlene Cote with IR who rec CT chest/ab/pelvis to w/u for malignancy and diagnostic mammogram ordered this and cancelled [place] right axilla CMET, CBC with diff and smear today Dr. Dareen Piano evaluated the mass today

## 2014-01-16 NOTE — Assessment & Plan Note (Addendum)
BP Readings from Last 3 Encounters:  01/16/14 121/69  05/24/13 110/61  05/18/13 140/78    Lab Results  Component Value Date   NA 134* 01/25/2013   K 4.3 01/25/2013   CREATININE 1.12* 01/25/2013    Assessment: Blood pressure control: controlled Progress toward BP goal:  at goal Comments: BP was controlled on Lisinopril 10-HCTZ 12.5 w/o side effects   Plan: Medications:  will lower to Lisinopril 2.5, HCTZ 12.5. Reassess BP at f/u -Rx refills today  Other plans: f/u in 3-6 months likely f/u in 6 months, CMET, lipid panel checked today

## 2014-01-17 NOTE — Progress Notes (Signed)
INTERNAL MEDICINE TEACHING ATTENDING ADDENDUM - Aldine Contes, MD: I personally saw and evaluated Ms. Maulding in this clinic visit in conjunction with the resident, Dr. Aundra Dubin. I have discussed patient's plan of care with medical resident during this visit. I have confirmed the physical exam findings and have read and agree with the clinic note including the plan with the following addition: - Patient with right axillary mass - On exam- mass is firm, non mobile approx 5*6 cms, non tender to palpation - Will get ultrasound to evaluate mass - Will likely need IR referral for biopsy/FNAC - discussed with patient in detail - DM, HTN at goal

## 2014-01-18 ENCOUNTER — Encounter: Payer: Self-pay | Admitting: Internal Medicine

## 2014-01-18 ENCOUNTER — Telehealth: Payer: Self-pay | Admitting: *Deleted

## 2014-01-18 LAB — MANUAL DIFFERENTIAL
Atypical Lymphocytes Manual: 0 %
BANDS MAN: 0 % (ref 0–5)
BLASTS MAN: 0 %
Basophils Manual: 0 % (ref 0–1)
EOSINO MAN: 1 % (ref 0–5)
Lymphocytes Manual: 46 % (ref 12–46)
Metamyelocytes Manual: 0 %
Monocytes Manual: 2 % — ABNORMAL LOW (ref 3–12)
Myelocytes Manual: 0 %
Neutrophils Manual: 51 % (ref 43–77)

## 2014-01-18 LAB — PATHOLOGIST SMEAR REVIEW

## 2014-01-18 NOTE — Telephone Encounter (Signed)
Call to patient and Emergency contact numbers.  Messages left that pt has had a change in tests that will require Contrast that will need to be picked up before the test.  Pt has also been scheduled for additional testing/Diagnostic Mammogram on the same day at [facility] at 12:45 with a 12:30 arrival time..  Message left to call Tyaire Odem at the Clinics as soon as possible.  Sander Nephew, RN 01/18/2014 9:26 AM

## 2014-01-18 NOTE — Addendum Note (Signed)
Addended by: Cresenciano Genre on: 01/18/2014 08:58 AM   Modules accepted: Orders

## 2014-01-24 ENCOUNTER — Ambulatory Visit (HOSPITAL_COMMUNITY): Payer: No Typology Code available for payment source

## 2014-01-24 ENCOUNTER — Ambulatory Visit (HOSPITAL_COMMUNITY): Payer: Self-pay

## 2014-01-24 ENCOUNTER — Other Ambulatory Visit: Payer: No Typology Code available for payment source

## 2014-01-30 ENCOUNTER — Encounter: Payer: Self-pay | Admitting: Internal Medicine

## 2014-01-30 ENCOUNTER — Ambulatory Visit (HOSPITAL_COMMUNITY)
Admission: RE | Admit: 2014-01-30 | Discharge: 2014-01-30 | Disposition: A | Discharge status: Home / Self Care | Payer: Self-pay | Source: Ambulatory Visit | Attending: Internal Medicine | Admitting: Internal Medicine

## 2014-01-30 DIAGNOSIS — R229 Localized swelling, mass and lump, unspecified: Secondary | ICD-10-CM | POA: Insufficient documentation

## 2014-01-30 DIAGNOSIS — R2231 Localized swelling, mass and lump, right upper limb: Secondary | ICD-10-CM

## 2014-01-30 MED ORDER — IOHEXOL 300 MG/ML  SOLN
80.0000 mL | Freq: Once | INTRAMUSCULAR | Status: AC | PRN
  Administered 2014-01-30: 80 mL via INTRAVENOUS

## 2014-01-31 ENCOUNTER — Telehealth: Payer: Self-pay | Admitting: Internal Medicine

## 2014-01-31 NOTE — Telephone Encounter (Signed)
Spoke with Grandaughter Mavis rescheduled b/l mammogram 02/06/14 at 2:15 at [facility].  Also reviewed recent CT chest/ab images. Grandaughter wants me to call another relative who is a doctor and disc Renovo Montclair MD

## 2014-02-06 ENCOUNTER — Ambulatory Visit
Admission: RE | Admit: 2014-02-06 | Discharge: 2014-02-06 | Disposition: A | Discharge status: Home / Self Care | Payer: No Typology Code available for payment source | Source: Ambulatory Visit | Attending: Internal Medicine | Admitting: Internal Medicine

## 2014-02-06 ENCOUNTER — Encounter (INDEPENDENT_AMBULATORY_CARE_PROVIDER_SITE_OTHER): Payer: Self-pay

## 2014-02-06 DIAGNOSIS — R2231 Localized swelling, mass and lump, right upper limb: Secondary | ICD-10-CM

## 2014-02-07 ENCOUNTER — Telehealth: Payer: Self-pay | Admitting: Internal Medicine

## 2014-02-07 NOTE — Telephone Encounter (Signed)
I received a call from Dr. Owens Shark this morning in regards to Connie Powell' R axillary mass and recent mammogram.  She recommends a CT chest with contrast first then proceeding with biopsy of mass.Dr. Owens Shark requested this information to be relayed to PCP, Dr. Aundra Dubin. After review of chart, CT chest with contrast was already done end of July. I then discussed the conversation with Dr. Aundra Dubin who then discussed again with Dr. Owens Shark on the phone. Please refer to Dr. Claris Gladden telephone note from today. Plan to proceed with ultrasound guided core biopsy of R axilla per Dr. Saul Fordyce addendum note on mammography.

## 2014-02-07 NOTE — Telephone Encounter (Signed)
Spoke with radiologist Dr. Owens Shark who will sch biopsy of lesion in right axilla  Aundra Dubin MD

## 2014-05-02 ENCOUNTER — Other Ambulatory Visit: Payer: Self-pay | Admitting: Internal Medicine

## 2014-05-02 ENCOUNTER — Other Ambulatory Visit: Payer: Self-pay | Admitting: *Deleted

## 2014-05-06 ENCOUNTER — Encounter: Payer: Self-pay | Admitting: Internal Medicine

## 2014-05-21 ENCOUNTER — Other Ambulatory Visit: Payer: Self-pay | Admitting: Internal Medicine

## 2014-06-13 ENCOUNTER — Telehealth: Payer: Self-pay | Admitting: *Deleted

## 2014-06-13 NOTE — Telephone Encounter (Signed)
Message from Dr. Owens Shark from the [facility] on yesterday to call..  Return page to Dr. Owens Shark on yesterday.  Call from [place] [facility]-problems getting patient to come in for biopsy of breast lump.  Wanted to see if the Clinics knew why.  Call to patient to speak with daughter as patient does not understand [place].  Call to patient's daughter [name] advised her need for patient to have biopsy of the breast lump.  [name] voiced understanding  of and was given number to call Lea at the [facility] or the Clinic number to schedule the appointment for her mother.  Call to [place] at the [facility] message was left that [place] patient's daughter had been contacted and will call the [facility] and ask for Lea during the morning hours.  Sander Nephew, RN 06/13/2014 11:15 AM.

## 2014-06-14 ENCOUNTER — Ambulatory Visit: Payer: No Typology Code available for payment source

## 2014-06-26 ENCOUNTER — Ambulatory Visit: Payer: No Typology Code available for payment source

## 2014-07-12 ENCOUNTER — Encounter: Payer: Self-pay | Admitting: *Deleted

## 2014-07-25 ENCOUNTER — Encounter: Payer: Self-pay | Admitting: Internal Medicine

## 2014-09-05 ENCOUNTER — Ambulatory Visit (INDEPENDENT_AMBULATORY_CARE_PROVIDER_SITE_OTHER): Payer: Self-pay | Admitting: Internal Medicine

## 2014-09-05 ENCOUNTER — Other Ambulatory Visit: Payer: Self-pay | Admitting: Internal Medicine

## 2014-09-05 ENCOUNTER — Encounter: Payer: Self-pay | Admitting: Internal Medicine

## 2014-09-05 VITALS — BP 171/64 | HR 65 | Temp 98.0°F | Ht 59.0 in | Wt 112.1 lb

## 2014-09-05 DIAGNOSIS — Z0000 Encounter for general adult medical examination without abnormal findings: Secondary | ICD-10-CM

## 2014-09-05 DIAGNOSIS — Z23 Encounter for immunization: Secondary | ICD-10-CM

## 2014-09-05 DIAGNOSIS — M810 Age-related osteoporosis without current pathological fracture: Secondary | ICD-10-CM

## 2014-09-05 DIAGNOSIS — D72819 Decreased white blood cell count, unspecified: Secondary | ICD-10-CM

## 2014-09-05 DIAGNOSIS — R2231 Localized swelling, mass and lump, right upper limb: Secondary | ICD-10-CM

## 2014-09-05 DIAGNOSIS — I10 Essential (primary) hypertension: Secondary | ICD-10-CM

## 2014-09-05 DIAGNOSIS — R87612 Low grade squamous intraepithelial lesion on cytologic smear of cervix (LGSIL): Secondary | ICD-10-CM

## 2014-09-05 DIAGNOSIS — E119 Type 2 diabetes mellitus without complications: Secondary | ICD-10-CM

## 2014-09-05 LAB — CBC WITH DIFFERENTIAL/PLATELET
Basophils Absolute: 0 10*3/uL (ref 0.0–0.1)
Basophils Relative: 0 % (ref 0–1)
EOS ABS: 0 10*3/uL (ref 0.0–0.7)
EOS PCT: 1 % (ref 0–5)
HEMATOCRIT: 35.7 % — AB (ref 36.0–46.0)
Hemoglobin: 11.7 g/dL — ABNORMAL LOW (ref 12.0–15.0)
Lymphocytes Relative: 31 % (ref 12–46)
Lymphs Abs: 1.3 10*3/uL (ref 0.7–4.0)
MCH: 30 pg (ref 26.0–34.0)
MCHC: 32.8 g/dL (ref 30.0–36.0)
MCV: 91.5 fL (ref 78.0–100.0)
MONO ABS: 0.2 10*3/uL (ref 0.1–1.0)
MONOS PCT: 4 % (ref 3–12)
MPV: 12.2 fL (ref 8.6–12.4)
Neutro Abs: 2.6 10*3/uL (ref 1.7–7.7)
Neutrophils Relative %: 64 % (ref 43–77)
Platelets: 160 10*3/uL (ref 150–400)
RBC: 3.9 MIL/uL (ref 3.87–5.11)
RDW: 13.2 % (ref 11.5–15.5)
WBC: 4.1 10*3/uL (ref 4.0–10.5)

## 2014-09-05 LAB — GLUCOSE, CAPILLARY: Glucose-Capillary: 88 mg/dL (ref 70–99)

## 2014-09-05 LAB — BASIC METABOLIC PANEL WITH GFR
BUN: 20 mg/dL (ref 6–23)
CO2: 28 meq/L (ref 19–32)
CREATININE: 0.87 mg/dL (ref 0.50–1.10)
Calcium: 9.4 mg/dL (ref 8.4–10.5)
Chloride: 104 mEq/L (ref 96–112)
GFR, EST AFRICAN AMERICAN: 76 mL/min
GFR, EST NON AFRICAN AMERICAN: 66 mL/min
Glucose, Bld: 126 mg/dL — ABNORMAL HIGH (ref 70–99)
POTASSIUM: 4.3 meq/L (ref 3.5–5.3)
Sodium: 141 mEq/L (ref 135–145)

## 2014-09-05 LAB — POCT GLYCOSYLATED HEMOGLOBIN (HGB A1C): Hemoglobin A1C: 6.6

## 2014-09-05 NOTE — Progress Notes (Signed)
   Subjective:    Patient ID: Connie Powell, female    DOB: [birth year], 74 y.o.   MRN: [id number]  HPI Comments: 74 y.o PMH LSIL (pathology 02/2010), cataracts, DM 2, HLD, HTN, osteoporosis, right axillary mass, constipation  She presents for f/u. Denies complaints today feels happy today  1. Right axillary mass had CT chest ab/pelvis 5.8 cm complex cyst ? Primary neoplasm vs metastatic dx. Pt never had [place] bx as rec. Will resch  2. HTN-mod elevated 171/64 then repeat BP 110/70.  on Lisinopril 2.5 mg, HCTZ 12.5 mg qd, check BMET today, denies h/a  3. DM 2-HA1C 6.7 01/2014 on Metformin 500 mg qd repeat A1C at goal  4. Osteoporosis-recently ran out ofCalcium and vitamin D. Grand daughter [name] will refill  5. H/o anemia, leukopenia-will check CBC today   SH: pt is able to still do ADLs, takes care of 2 great grandkids at home  HM: will get flu shot today, due for eye exam (grand daughter to call and resch), will check HA1C today   With grandaughter [name] today new phone # updated        Review of Systems  Respiratory: Negative for shortness of breath.   Cardiovascular: Negative for chest pain.  Gastrointestinal: Negative for abdominal pain and constipation.       Objective:   Physical Exam  Constitutional: She is oriented to person, place, and time. Vital signs are normal. She appears well-developed and well-nourished. She is cooperative. No distress.  HENT:  Head: Normocephalic and atraumatic.  Mouth/Throat: Oropharynx is clear and moist and mucous membranes are normal. Abnormal dentition. No oropharyngeal exudate.  Eyes: Conjunctivae are normal. Pupils are equal, round, and reactive to light. Right eye exhibits no discharge. Left eye exhibits no discharge. No scleral icterus.  Cardiovascular: Normal rate, regular rhythm, S1 normal, S2 normal and normal heart sounds.   No murmur heard. Trace leg edema b/l   Pulmonary/Chest: Effort normal and breath sounds normal. No respiratory  distress. She has no wheezes.  Abdominal: Soft. Bowel sounds are normal. There is no tenderness.  Neurological: She is alert and oriented to person, place, and time. Gait normal.  Skin: Skin is warm and dry. No rash noted. She is not diaphoretic.     Psychiatric: She has a normal mood and affect. Her speech is normal and behavior is normal. Judgment and thought content normal. Cognition and memory are normal.  Nursing note and vitals reviewed.         Assessment & Plan:

## 2014-09-05 NOTE — Patient Instructions (Addendum)
General Instructions: We will try to get your biopsy scheduled  We will get blood work today  Please take medications as prescribed  Please schedule eye exam with your eye doctor  Take care  F/u in 3 months    Treatment Goals:  Goals (1 Years of Data) as of 09/05/14          As of Today 01/16/14 05/24/13 03/03/11     Blood Pressure   . Blood Pressure < 140/90  171/64 121/69 110/61    . Blood Pressure < 150/90  171/64 121/69 110/61      Result Component   . HEMOGLOBIN A1C < 7.0   6.7 6.1    . HEMOGLOBIN A1C < 8.0   6.7 6.1    . LDL CALC < 100   135  85      Progress Toward Treatment Goals:  Treatment Goal 09/05/2014  Hemoglobin A1C (No Data)  Blood pressure deteriorated    Self Care Goals & Plans:  Self Care Goal 09/05/2014  Manage my medications take my medicines as prescribed; bring my medications to every visit; refill my medications on time; follow the sick day instructions if I am sick  Monitor my health keep track of my blood pressure  Eat healthy foods drink diet soda or water instead of juice or soda; eat more vegetables; eat foods that are low in salt; eat baked foods instead of fried foods; eat fruit for snacks and desserts; eat smaller portions  Be physically active -  Meeting treatment goals maintain the current self-care plan    Home Blood Glucose Monitoring 09/05/2014  Check my blood sugar no home glucose monitoring  When to check my blood sugar -     Care Management & Community Referrals:  Referral 09/05/2014  Referrals made for care management support -  Referrals made to community resources none       Hypertension Hypertension, commonly called high blood pressure, is when the force of blood pumping through your arteries is too strong. Your arteries are the blood vessels that carry blood from your heart throughout your body. A blood pressure reading consists of a higher number over a lower number, such as 110/72. The higher number (systolic) is the  pressure inside your arteries when your heart pumps. The lower number (diastolic) is the pressure inside your arteries when your heart relaxes. Ideally you want your blood pressure below 120/80. Hypertension forces your heart to work harder to pump blood. Your arteries may become narrow or stiff. Having hypertension puts you at risk for heart disease, stroke, and other problems.  RISK FACTORS Some risk factors for high blood pressure are controllable. Others are not.  Risk factors you cannot control include:   Race. You may be at higher risk if you are African American.  Age. Risk increases with age.  Gender. Men are at higher risk than women before age 3 years. After age 75, women are at higher risk than men. Risk factors you can control include:  Not getting enough exercise or physical activity.  Being overweight.  Getting too much fat, sugar, calories, or salt in your diet.  Drinking too much alcohol. SIGNS AND SYMPTOMS Hypertension does not usually cause signs or symptoms. Extremely high blood pressure (hypertensive crisis) may cause headache, anxiety, shortness of breath, and nosebleed. DIAGNOSIS  To check if you have hypertension, your health care provider will measure your blood pressure while you are seated, with your arm held at the level of your  heart. It should be measured at least twice using the same arm. Certain conditions can cause a difference in blood pressure between your right and left arms. A blood pressure reading that is higher than normal on one occasion does not mean that you need treatment. If one blood pressure reading is high, ask your health care provider about having it checked again. TREATMENT  Treating high blood pressure includes making lifestyle changes and possibly taking medicine. Living a healthy lifestyle can help lower high blood pressure. You may need to change some of your habits. Lifestyle changes may include:  Following the DASH diet. This diet  is high in fruits, vegetables, and whole grains. It is low in salt, red meat, and added sugars.  Getting at least 2 hours of brisk physical activity every week.  Losing weight if necessary.  Not smoking.  Limiting alcoholic beverages.  Learning ways to reduce stress. If lifestyle changes are not enough to get your blood pressure under control, your health care provider may prescribe medicine. You may need to take more than one. Work closely with your health care provider to understand the risks and benefits. HOME CARE INSTRUCTIONS  Have your blood pressure rechecked as directed by your health care provider.   Take medicines only as directed by your health care provider. Follow the directions carefully. Blood pressure medicines must be taken as prescribed. The medicine does not work as well when you skip doses. Skipping doses also puts you at risk for problems.   Do not smoke.   Monitor your blood pressure at home as directed by your health care provider. SEEK MEDICAL CARE IF:   You think you are having a reaction to medicines taken.  You have recurrent headaches or feel dizzy.  You have swelling in your ankles.  You have trouble with your vision. SEEK IMMEDIATE MEDICAL CARE IF:  You develop a severe headache or confusion.  You have unusual weakness, numbness, or feel faint.  You have severe chest or abdominal pain.  You vomit repeatedly.  You have trouble breathing. MAKE SURE YOU:   Understand these instructions.  Will watch your condition.  Will get help right away if you are not doing well or get worse. Document Released: 06/21/2005 Document Revised: 11/05/2013 Document Reviewed: 04/13/2013 [facility] Patient Information 2015 [place], [place]. This information is not intended to replace advice given to you by your health care provider. Make sure you discuss any questions you have with your health care provider.

## 2014-09-06 NOTE — Assessment & Plan Note (Signed)
Noted on previous DEXA pt declined to take Fosamax in the past and today  Encouraged to get OTC Vitamin D at least 800 IU and Ca 1200 mg qd

## 2014-09-06 NOTE — Assessment & Plan Note (Signed)
Will repeat CBC again

## 2014-09-06 NOTE — Addendum Note (Signed)
Addended by: Cresenciano Genre on: 09/06/2014 09:49 AM   Modules accepted: Level of Service

## 2014-09-06 NOTE — Assessment & Plan Note (Addendum)
?  Primary neoplasm vs met dz Will try to sch appt for [place] and bx again ASAP

## 2014-09-06 NOTE — Assessment & Plan Note (Signed)
Lab Results  Component Value Date   HGBA1C 6.6 09/05/2014   HGBA1C 6.7 01/16/2014   HGBA1C 6.1 05/24/2013     Assessment: Diabetes control: good control (HgbA1C at goal) Progress toward A1C goal:   (pending) Comments: at goal  Plan: Medications:  continue current medications Metformin 500 mg qd  Home glucose monitoring: Frequency: no home glucose monitoring Other plans: repeat A1C today, f/u in 3 months

## 2014-09-06 NOTE — Assessment & Plan Note (Signed)
Path noted 02/2013  Pt last seen OB/GYN 05/2013 needs f/u  Will refer back to OB/GYN

## 2014-09-06 NOTE — Assessment & Plan Note (Signed)
BP Readings from Last 3 Encounters:  09/05/14 171/64  01/16/14 121/69  05/24/13 110/61    Lab Results  Component Value Date   NA 141 09/05/2014   K 4.3 09/05/2014   CREATININE 0.87 09/05/2014    Assessment: Blood pressure control: moderately elevated Progress toward BP goal:  deteriorated Comments: repeat BP 110/70   Plan: Medications:  continue current medications Lisinopril 2.5 mg, HCTZ 12.5 mg qd  Educational resources provided: handout Other plans: check BMET today, f/u in 3 months

## 2014-09-06 NOTE — Assessment & Plan Note (Addendum)
Flu today, encouraged to sch eye exam (Dr. Susa Simmonds), will address Zoster in future, check A1C today  Will likely need to f/u with OB/GYN ASAP Will need prevnar at f/u

## 2014-09-09 NOTE — Progress Notes (Signed)
Internal Medicine Clinic Attending  Case discussed with Dr. McLean soon after the resident saw the patient.  We reviewed the resident's history and exam and pertinent patient test results.  I agree with the assessment, diagnosis, and plan of care documented in the resident's note. 

## 2014-10-03 ENCOUNTER — Encounter: Payer: Self-pay | Admitting: Family Medicine

## 2014-10-03 ENCOUNTER — Ambulatory Visit: Payer: Self-pay | Admitting: Obstetrics & Gynecology

## 2014-10-16 ENCOUNTER — Encounter: Payer: Self-pay | Admitting: Obstetrics and Gynecology

## 2014-10-16 ENCOUNTER — Ambulatory Visit (INDEPENDENT_AMBULATORY_CARE_PROVIDER_SITE_OTHER): Payer: Self-pay | Admitting: Obstetrics and Gynecology

## 2014-10-16 VITALS — BP 134/59 | HR 77 | Temp 97.8°F | Resp 20 | Wt 108.6 lb

## 2014-10-16 DIAGNOSIS — N63 Unspecified lump in unspecified breast: Secondary | ICD-10-CM

## 2014-10-16 DIAGNOSIS — Z124 Encounter for screening for malignant neoplasm of cervix: Secondary | ICD-10-CM

## 2014-10-16 DIAGNOSIS — Z1151 Encounter for screening for human papillomavirus (HPV): Secondary | ICD-10-CM

## 2014-10-16 DIAGNOSIS — Z01419 Encounter for gynecological examination (general) (routine) without abnormal findings: Secondary | ICD-10-CM

## 2014-10-16 NOTE — Progress Notes (Signed)
  Subjective:     Connie Powell is a 74 y.o. female postmenopausal who is here for a comprehensive physical exam. The patient reports no problems. Patient denies any abnormal vaginal bleeding. She is not sexually active. She denies incontinence. Patient had an abnormal mammogram in 02/2014 and failed to follow up for biopsy  History   Social History  . Marital Status: Widowed    Spouse Name: N/A  . Number of Children: N/A  . Years of Education: N/A   Occupational History  . Not on file.   Social History Main Topics  . Smoking status: Never Smoker   . Smokeless tobacco: Never Used  . Alcohol Use: No  . Drug Use: No  . Sexual Activity: No   Other Topics Concern  . Not on file   Social History Narrative   Moved here from [place] in 2004.Lives with her daughter.   Health Maintenance  Topic Date Due  . ZOSTAVAX  03/06/2001  . PNA vac Low Risk Adult (2 of 2 - PCV13) 02/10/2011  . OPHTHALMOLOGY EXAM  01/31/2014  . HEMOGLOBIN A1C  12/06/2014  . FOOT EXAM  01/17/2015  . LIPID PANEL  01/17/2015  . INFLUENZA VACCINE  02/03/2015  . MAMMOGRAM  02/07/2016  . COLONOSCOPY  07/09/2019  . TETANUS/TDAP  02/10/2020  . DEXA SCAN  Completed   Past Medical History  Diagnosis Date  . Diabetes mellitus     history of last HA1C 05/2013 6.1, over the years been <6.5   . Hyperlipidemia   . Hypertension   . 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 Osteoporosis     Dexa scan 4/09, starting alendronate 6/09; DEXA 01/29/2013 with T score ranges -2.5 to -3.4 rec repeat DEXA in 1 yr  . 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 Chronic cough     with negative chest CT  . 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 LSIL (low grade squamous intraepithelial lesion) on Pap smear     04/2013    Past Surgical History  Procedure Laterality Date  . Cholecystectomy    . Cataract extraction  10/2011, 04/2012    both eyes   History  reviewed. No pertinent family history.     Review of Systems Pertinent items are noted in HPI.   Objective:      GENERAL: Well-developed, well-nourished female in no acute distress.  HEENT: Normocephalic, atraumatic. Sclerae anicteric.  NECK: Supple. Normal thyroid.  LUNGS: Clear to auscultation bilaterally.  HEART: Regular rate and rhythm. BREASTS: Symmetric in size. No palpable masses or lymphadenopathy, skin changes, or nipple drainage. There is a 10 cm right mass on the tail of the breast tissue towards axilla. Non tender. Hard to touch ABDOMEN: Soft, nontender, nondistended. No organomegaly. PELVIC: Normal external female genitalia. Vagina is pink and rugated.  Normal discharge. Normal appearing cervix. Uterus is normal in size. No adnexal mass or tenderness. EXTREMITIES: No cyanosis, clubbing, or edema, 2+ distal pulses.    Assessment:    Healthy female exam.      Plan:    Pap smear collected Will schedule mass biopsy with breast center Patient advised to perform monthly self breast and vulva exams Patient will be contacted with any abnormal results See After Visit Summary for Counseling Recommendations

## 2014-10-16 NOTE — Progress Notes (Signed)
[place] interpreter [phone]) # [id number] used for encounter

## 2014-10-17 LAB — CYTOLOGY - PAP

## 2014-10-21 ENCOUNTER — Telehealth: Payer: Self-pay | Admitting: *Deleted

## 2014-10-21 NOTE — Telephone Encounter (Signed)
-----   Message from Mora Bellman, MD sent at 10/21/2014 10:46 AM EDT ----- Patient needs colpo

## 2014-10-21 NOTE — Telephone Encounter (Signed)
Message sent to [facility] to schedule colpo, then they will send back so that nurses can contact patient with results and colpo appt.  Pt speaks [place].

## 2014-10-23 ENCOUNTER — Ambulatory Visit
Admission: RE | Admit: 2014-10-23 | Discharge: 2014-10-23 | Disposition: A | Discharge status: Home / Self Care | Payer: No Typology Code available for payment source | Source: Ambulatory Visit | Attending: Obstetrics and Gynecology | Admitting: Obstetrics and Gynecology

## 2014-10-23 ENCOUNTER — Other Ambulatory Visit: Payer: Self-pay | Admitting: Obstetrics and Gynecology

## 2014-10-23 DIAGNOSIS — N63 Unspecified lump in unspecified breast: Secondary | ICD-10-CM

## 2014-10-23 NOTE — Telephone Encounter (Signed)
Colpo appointment 11/07/14 1:15.

## 2014-10-23 NOTE — Telephone Encounter (Signed)
Contacted patient with [employer] [id number], information given concerning colpo appiontment. Pt verbalizes understanding and has no further questions.

## 2014-10-28 ENCOUNTER — Other Ambulatory Visit: Payer: Self-pay | Admitting: Internal Medicine

## 2014-10-28 DIAGNOSIS — D3610 Benign neoplasm of peripheral nerves and autonomic nervous system, unspecified: Secondary | ICD-10-CM

## 2014-11-06 LAB — HM DIABETES EYE EXAM

## 2014-11-07 ENCOUNTER — Encounter: Payer: Self-pay | Admitting: Obstetrics and Gynecology

## 2014-11-20 ENCOUNTER — Encounter: Payer: Self-pay | Admitting: *Deleted

## 2014-11-26 NOTE — Addendum Note (Signed)
Addended by: Hulan Fray on: 11/26/2014 06:29 AM   Modules accepted: Orders

## 2014-12-04 ENCOUNTER — Ambulatory Visit (INDEPENDENT_AMBULATORY_CARE_PROVIDER_SITE_OTHER): Payer: Self-pay | Admitting: Obstetrics and Gynecology

## 2014-12-04 ENCOUNTER — Other Ambulatory Visit (HOSPITAL_COMMUNITY)
Admission: RE | Admit: 2014-12-04 | Discharge: 2014-12-04 | Disposition: A | Discharge status: Home / Self Care | Payer: No Typology Code available for payment source | Source: Ambulatory Visit | Attending: Obstetrics and Gynecology | Admitting: Obstetrics and Gynecology

## 2014-12-04 ENCOUNTER — Encounter: Payer: Self-pay | Admitting: Obstetrics and Gynecology

## 2014-12-04 VITALS — BP 146/68 | HR 80 | Temp 98.2°F | Wt 110.4 lb

## 2014-12-04 DIAGNOSIS — R87619 Unspecified abnormal cytological findings in specimens from cervix uteri: Secondary | ICD-10-CM | POA: Insufficient documentation

## 2014-12-04 DIAGNOSIS — R87612 Low grade squamous intraepithelial lesion on cytologic smear of cervix (LGSIL): Secondary | ICD-10-CM | POA: Insufficient documentation

## 2014-12-04 NOTE — Progress Notes (Signed)
Patient ID: Connie Powell, female   DOB: 1940/09/20, 74 y.o.   MRN: [id number]  74 yo with LGSIL on 10/16/2014 Patient given informed consent, signed copy in the chart, time out was performed.  Placed in lithotomy position. Cervix viewed with speculum and colposcope after application of acetic acid.   Colposcopy adequate?  No TZ not visualized. Very atrophic cervix Acetowhite lesions?yes at 6 o'clock Punctation?no Mosaicism?  no Abnormal vasculature?  no Biopsies?6 o'clock ECC?yes  COMMENTS: Patient was given post procedure instructions.  She will return in 2 weeks for results.  Mora Bellman, MD

## 2014-12-04 NOTE — Progress Notes (Signed)
[place] interpreter [phone] used.

## 2014-12-16 ENCOUNTER — Telehealth: Payer: Self-pay | Admitting: General Practice

## 2014-12-16 NOTE — Telephone Encounter (Signed)
Telephone call to patient regarding need for follow up pap in 6 months. Called patient with [place] interpreter [phone], no answer- left message stating we are trying to reach you with results, please call us back at the clinics

## 2014-12-24 ENCOUNTER — Ambulatory Visit: Payer: No Typology Code available for payment source

## 2014-12-25 ENCOUNTER — Encounter: Payer: Self-pay | Admitting: *Deleted

## 2014-12-25 NOTE — Telephone Encounter (Signed)
Letter to patient

## 2015-02-26 ENCOUNTER — Ambulatory Visit: Payer: Self-pay

## 2015-02-28 ENCOUNTER — Ambulatory Visit: Payer: Self-pay

## 2015-03-17 ENCOUNTER — Encounter: Payer: Self-pay | Admitting: Internal Medicine

## 2015-03-17 ENCOUNTER — Ambulatory Visit (INDEPENDENT_AMBULATORY_CARE_PROVIDER_SITE_OTHER): Payer: Self-pay | Admitting: Internal Medicine

## 2015-03-17 VITALS — BP 138/59 | HR 66 | Temp 98.3°F | Ht 59.0 in | Wt 112.1 lb

## 2015-03-17 DIAGNOSIS — R87612 Low grade squamous intraepithelial lesion on cytologic smear of cervix (LGSIL): Secondary | ICD-10-CM

## 2015-03-17 DIAGNOSIS — E119 Type 2 diabetes mellitus without complications: Secondary | ICD-10-CM

## 2015-03-17 DIAGNOSIS — I10 Essential (primary) hypertension: Secondary | ICD-10-CM

## 2015-03-17 DIAGNOSIS — R2231 Localized swelling, mass and lump, right upper limb: Secondary | ICD-10-CM

## 2015-03-17 LAB — POCT GLYCOSYLATED HEMOGLOBIN (HGB A1C): Hemoglobin A1C: 6.8

## 2015-03-17 LAB — GLUCOSE, CAPILLARY: Glucose-Capillary: 170 mg/dL — ABNORMAL HIGH (ref 65–99)

## 2015-03-17 MED ORDER — LISINOPRIL 2.5 MG PO TABS: 2.5000 mg | ORAL_TABLET | Freq: Every day | ORAL | Status: DC

## 2015-03-17 MED ORDER — HYDROCHLOROTHIAZIDE 12.5 MG PO CAPS: 12.5000 mg | ORAL_CAPSULE | Freq: Every day | ORAL | Status: DC

## 2015-03-17 MED ORDER — METFORMIN HCL 500 MG PO TABS: 500.0000 mg | ORAL_TABLET | Freq: Every day | ORAL | Status: DC

## 2015-03-17 NOTE — Progress Notes (Signed)
   Subjective:    Patient ID: Connie Powell, female    DOB: 01/13/1941, 74 y.o.   MRN: [id number]  HPI 74 y/o F w/PMHx of HTN, DM2, osteoporosis, and rt axilla schwannoma who presents for medication refills. Please see problem list for further details.      Review of Systems  Constitutional: Negative for fever.  Respiratory: Negative for shortness of breath.   Cardiovascular: Negative for chest pain.  Gastrointestinal: Negative for abdominal pain.  Genitourinary: Negative for difficulty urinating.  Musculoskeletal: Negative for back pain.  Psychiatric/Behavioral: Negative for dysphoric mood.       Objective:   Physical Exam  Constitutional: She appears well-developed and well-nourished. No distress.  HENT:  Head: Normocephalic and atraumatic.  Cardiovascular: Normal rate, regular rhythm and normal heart sounds.   No murmur heard. Pulmonary/Chest: Effort normal and breath sounds normal. No respiratory distress. She has no wheezes.  Abdominal: Soft. Bowel sounds are normal. She exhibits no distension.  Musculoskeletal: She exhibits no edema.  Large round rt axilla mass that is non tender and mobile  Neurological: She is alert.  Skin: Skin is warm and dry.          Assessment & Plan:  Please see problem based assessment and plan.

## 2015-03-17 NOTE — Patient Instructions (Signed)
You are due for a pap smear in December 2016. You can call Dr. Domenic Schwab office at [phone]

## 2015-03-18 ENCOUNTER — Telehealth: Payer: Self-pay | Admitting: *Deleted

## 2015-03-18 NOTE — Telephone Encounter (Signed)
Call to [facility] to ask about the rescheduling of patient's appointment for surgery.  Spoke to scheduler-patinet did not keep any of the previous appointments.  Will need to be referred again.  Dr. Hulen Luster was informed of.  Sander Nephew, RN 03/17/2015  4:50 PM

## 2015-03-18 NOTE — Progress Notes (Signed)
Internal Medicine Clinic Attending  Case discussed with Dr. Truong at the time of the visit.  We reviewed the resident's history and exam and pertinent patient test results.  I agree with the assessment, diagnosis, and plan of care documented in the resident's note.  

## 2015-03-18 NOTE — Assessment & Plan Note (Signed)
Biopsy of rt axilla mass positive for a schwanomma, it was recommended to have it resected due to its size. Referral placed for general surgery.

## 2015-03-18 NOTE — Assessment & Plan Note (Signed)
BP Readings from Last 3 Encounters:  03/17/15 138/59  12/04/14 146/68  10/16/14 134/59    Lab Results  Component Value Date   NA 141 09/05/2014   K 4.3 09/05/2014   CREATININE 0.87 09/05/2014    Assessment: Blood pressure control: controlled Progress toward BP goal:  at goal Comments: on lisinopril 2.5ng daily and HCTZ 12.5mg  daily.  Plan: Medications:  continue current medications Educational resources provided: brochure (denied) Self management tools provided:   Other plans: f/u in 6 months for BP check. Refilled ACEi and HCTZ

## 2015-03-18 NOTE — Assessment & Plan Note (Signed)
Pt due for a pap smear in December 2016. Given number to her ob/gyn's office on AVS sheet and instructed pt to call and make an appointment for this in December.

## 2015-03-18 NOTE — Assessment & Plan Note (Signed)
Lab Results  Component Value Date   HGBA1C 6.8 03/17/2015   HGBA1C 6.6 09/05/2014   HGBA1C 6.7 01/16/2014     Assessment: Diabetes control: good control (HgbA1C at goal) Progress toward A1C goal:  at goal Comments: on metformin 500mg  q am.   Plan: Medications:  continue current medications Home glucose monitoring: Frequency: no home glucose monitoring Timing: N/A Instruction/counseling given: discussed foot care Educational resources provided: brochure (denied) Self management tools provided:   Other plans: foot exam done today, f/u in 6 months for HbA1c

## 2015-04-09 ENCOUNTER — Inpatient Hospital Stay (HOSPITAL_COMMUNITY)
Admission: EM | Admit: 2015-04-09 | Discharge: 2015-04-10 | DRG: 392 | Disposition: A | Discharge status: Home / Self Care | Payer: Self-pay | Attending: Internal Medicine | Admitting: Internal Medicine

## 2015-04-09 ENCOUNTER — Emergency Department (HOSPITAL_COMMUNITY): Payer: Self-pay

## 2015-04-09 ENCOUNTER — Encounter (HOSPITAL_COMMUNITY): Payer: Self-pay | Admitting: *Deleted

## 2015-04-09 DIAGNOSIS — A419 Sepsis, unspecified organism: Secondary | ICD-10-CM

## 2015-04-09 DIAGNOSIS — E785 Hyperlipidemia, unspecified: Secondary | ICD-10-CM | POA: Diagnosis present

## 2015-04-09 DIAGNOSIS — Z9842 Cataract extraction status, left eye: Secondary | ICD-10-CM

## 2015-04-09 DIAGNOSIS — M810 Age-related osteoporosis without current pathological fracture: Secondary | ICD-10-CM | POA: Diagnosis present

## 2015-04-09 DIAGNOSIS — E119 Type 2 diabetes mellitus without complications: Secondary | ICD-10-CM | POA: Diagnosis present

## 2015-04-09 DIAGNOSIS — Z9841 Cataract extraction status, right eye: Secondary | ICD-10-CM

## 2015-04-09 DIAGNOSIS — I10 Essential (primary) hypertension: Secondary | ICD-10-CM | POA: Diagnosis present

## 2015-04-09 DIAGNOSIS — A084 Viral intestinal infection, unspecified: Principal | ICD-10-CM | POA: Diagnosis present

## 2015-04-09 DIAGNOSIS — K529 Noninfective gastroenteritis and colitis, unspecified: Secondary | ICD-10-CM | POA: Diagnosis present

## 2015-04-09 DIAGNOSIS — Z8611 Personal history of tuberculosis: Secondary | ICD-10-CM

## 2015-04-09 DIAGNOSIS — Z794 Long term (current) use of insulin: Secondary | ICD-10-CM

## 2015-04-09 DIAGNOSIS — E109 Type 1 diabetes mellitus without complications: Secondary | ICD-10-CM | POA: Diagnosis present

## 2015-04-09 DIAGNOSIS — M1990 Unspecified osteoarthritis, unspecified site: Secondary | ICD-10-CM | POA: Diagnosis present

## 2015-04-09 DIAGNOSIS — Z79899 Other long term (current) drug therapy: Secondary | ICD-10-CM

## 2015-04-09 DIAGNOSIS — Z87410 Personal history of cervical dysplasia: Secondary | ICD-10-CM

## 2015-04-09 LAB — I-STAT CG4 LACTIC ACID, ED
LACTIC ACID, VENOUS: 4.33 mmol/L — AB (ref 0.5–2.0)
LACTIC ACID, VENOUS: 2.1 mmol/L — AB (ref 0.5–2.0)

## 2015-04-09 LAB — URINALYSIS, ROUTINE W REFLEX MICROSCOPIC
BILIRUBIN URINE: NEGATIVE
Glucose, UA: NEGATIVE mg/dL
HGB URINE DIPSTICK: NEGATIVE
KETONES UR: NEGATIVE mg/dL
Leukocytes, UA: NEGATIVE
Nitrite: NEGATIVE
PH: 5 (ref 5.0–8.0)
Protein, ur: NEGATIVE mg/dL
Specific Gravity, Urine: 1.014 (ref 1.005–1.030)
Urobilinogen, UA: 0.2 mg/dL (ref 0.0–1.0)

## 2015-04-09 LAB — COMPREHENSIVE METABOLIC PANEL
ALT: 17 U/L (ref 14–54)
AST: 30 U/L (ref 15–41)
Albumin: 4.4 g/dL (ref 3.5–5.0)
Alkaline Phosphatase: 67 U/L (ref 38–126)
Anion gap: 14 (ref 5–15)
BUN: 23 mg/dL — AB (ref 6–20)
CHLORIDE: 101 mmol/L (ref 101–111)
CO2: 22 mmol/L (ref 22–32)
Calcium: 9.7 mg/dL (ref 8.9–10.3)
Creatinine, Ser: 1.01 mg/dL — ABNORMAL HIGH (ref 0.44–1.00)
GFR calc Af Amer: >60 mL/min (ref >60)
GFR, EST NON AFRICAN AMERICAN: 53 mL/min — AB (ref >60)
GLUCOSE: 138 mg/dL — AB (ref 65–99)
Potassium: 3.1 mmol/L — ABNORMAL LOW (ref 3.5–5.1)
Sodium: 137 mmol/L (ref 135–145)
Total Bilirubin: 1.5 mg/dL — ABNORMAL HIGH (ref 0.3–1.2)
Total Protein: 7.9 g/dL (ref 6.5–8.1)

## 2015-04-09 LAB — I-STAT TROPONIN, ED: Troponin i, poc: 0 ng/mL (ref 0.00–0.08)

## 2015-04-09 LAB — LACTIC ACID, PLASMA
LACTIC ACID, VENOUS: 1.8 mmol/L (ref 0.5–2.0)
Lactic Acid, Venous: 2.2 mmol/L (ref 0.5–2.0)

## 2015-04-09 LAB — LIPASE, BLOOD: Lipase: 36 U/L (ref 22–51)

## 2015-04-09 LAB — CBC
HCT: 41.9 % (ref 36.0–46.0)
Hemoglobin: 14.3 g/dL (ref 12.0–15.0)
MCH: 31 pg (ref 26.0–34.0)
MCHC: 34.1 g/dL (ref 30.0–36.0)
MCV: 90.9 fL (ref 78.0–100.0)
PLATELETS: 173 10*3/uL (ref 150–400)
RBC: 4.61 MIL/uL (ref 3.87–5.11)
RDW: 12.7 % (ref 11.5–15.5)
WBC: 16 10*3/uL — AB (ref 4.0–10.5)

## 2015-04-09 LAB — MAGNESIUM: MAGNESIUM: 1.7 mg/dL (ref 1.7–2.4)

## 2015-04-09 LAB — CBG MONITORING, ED: Glucose-Capillary: 119 mg/dL — ABNORMAL HIGH (ref 65–99)

## 2015-04-09 LAB — GLUCOSE, CAPILLARY: GLUCOSE-CAPILLARY: 159 mg/dL — AB (ref 65–99)

## 2015-04-09 MED ORDER — ENOXAPARIN SODIUM 30 MG/0.3ML ~~LOC~~ SOLN
30.0000 mg | SUBCUTANEOUS | Status: DC
  Administered 2015-04-09: 30 mg via SUBCUTANEOUS
  Filled 2015-04-09 (×2): qty 0.3

## 2015-04-09 MED ORDER — SODIUM CHLORIDE 0.9 % IV BOLUS (SEPSIS)
500.0000 mL | Freq: Once | INTRAVENOUS | Status: AC
  Administered 2015-04-09: 500 mL via INTRAVENOUS

## 2015-04-09 MED ORDER — SODIUM CHLORIDE 0.9 % IV BOLUS (SEPSIS)
1000.0000 mL | Freq: Once | INTRAVENOUS | Status: AC
  Administered 2015-04-09: 1000 mL via INTRAVENOUS

## 2015-04-09 MED ORDER — INSULIN ASPART 100 UNIT/ML ~~LOC~~ SOLN: 0.0000 [IU] | Freq: Three times a day (TID) | SUBCUTANEOUS | Status: DC

## 2015-04-09 MED ORDER — PIPERACILLIN-TAZOBACTAM 3.375 G IVPB 30 MIN
3.3750 g | Freq: Once | INTRAVENOUS | Status: AC
  Administered 2015-04-09: 3.375 g via INTRAVENOUS
  Filled 2015-04-09: qty 50

## 2015-04-09 MED ORDER — POTASSIUM CHLORIDE CRYS ER 20 MEQ PO TBCR
40.0000 meq | EXTENDED_RELEASE_TABLET | Freq: Once | ORAL | Status: AC
  Administered 2015-04-09: 40 meq via ORAL
  Filled 2015-04-09: qty 2

## 2015-04-09 MED ORDER — ONDANSETRON 4 MG PO TBDP
4.0000 mg | ORAL_TABLET | Freq: Three times a day (TID) | ORAL | Status: DC | PRN
Start: 2015-04-09 — End: 2015-04-10
  Filled 2015-04-09: qty 1

## 2015-04-09 MED ORDER — INFLUENZA VAC SPLIT QUAD 0.5 ML IM SUSY: 0.5000 mL | PREFILLED_SYRINGE | INTRAMUSCULAR | Status: DC | PRN

## 2015-04-09 MED ORDER — VANCOMYCIN HCL IN DEXTROSE 1-5 GM/200ML-% IV SOLN
1000.0000 mg | Freq: Once | INTRAVENOUS | Status: AC
  Administered 2015-04-09: 1000 mg via INTRAVENOUS
  Filled 2015-04-09: qty 200

## 2015-04-09 MED ORDER — SODIUM CHLORIDE 0.9 % IV BOLUS (SEPSIS)
500.0000 mL | INTRAVENOUS | Status: AC
  Administered 2015-04-09: 500 mL via INTRAVENOUS

## 2015-04-09 MED ORDER — POTASSIUM CHLORIDE 10 MEQ/100ML IV SOLN
10.0000 meq | INTRAVENOUS | Status: DC
  Administered 2015-04-09: 10 meq via INTRAVENOUS
  Filled 2015-04-09 (×2): qty 100

## 2015-04-09 MED ORDER — ACETAMINOPHEN 650 MG RE SUPP
650.0000 mg | Freq: Four times a day (QID) | RECTAL | Status: DC | PRN
Start: 2015-04-09 — End: 2015-04-10

## 2015-04-09 MED ORDER — SODIUM CHLORIDE 0.9 % IV SOLN
INTRAVENOUS | Status: AC
  Administered 2015-04-09: 22:00:00 via INTRAVENOUS

## 2015-04-09 MED ORDER — PIPERACILLIN-TAZOBACTAM 3.375 G IVPB
3.3750 g | Freq: Three times a day (TID) | INTRAVENOUS | Status: DC
  Filled 2015-04-09 (×2): qty 50

## 2015-04-09 MED ORDER — INFLUENZA VAC SPLIT QUAD 0.5 ML IM SUSY: 0.5000 mL | PREFILLED_SYRINGE | INTRAMUSCULAR | Status: DC

## 2015-04-09 MED ORDER — ACETAMINOPHEN 325 MG PO TABS: 650.0000 mg | ORAL_TABLET | Freq: Four times a day (QID) | ORAL | Status: DC | PRN

## 2015-04-09 MED ORDER — POTASSIUM CHLORIDE 10 MEQ/100ML IV SOLN: 10.0000 meq | INTRAVENOUS | Status: DC

## 2015-04-09 MED ORDER — ONDANSETRON HCL 4 MG/2ML IJ SOLN
4.0000 mg | Freq: Once | INTRAMUSCULAR | Status: AC
  Administered 2015-04-09: 4 mg via INTRAVENOUS
  Filled 2015-04-09: qty 2

## 2015-04-09 MED ORDER — VANCOMYCIN HCL IN DEXTROSE 750-5 MG/150ML-% IV SOLN: 750.0000 mg | INTRAVENOUS | Status: DC

## 2015-04-09 MED ORDER — CALCIUM CARBONATE-VITAMIN D 500-200 MG-UNIT PO TABS
1.0000 | ORAL_TABLET | Freq: Every day | ORAL | Status: DC
  Administered 2015-04-10: 1 via ORAL
  Filled 2015-04-09: qty 1

## 2015-04-09 MED ORDER — CALCIUM 1200-1000 MG-UNIT PO CHEW: 1.0000 | CHEWABLE_TABLET | Freq: Every day | ORAL | Status: DC

## 2015-04-09 NOTE — ED Notes (Addendum)
Spoke to teaching service in regards to bed placement. Will change level of care to step-down even though pt is appropriate for tele floor.

## 2015-04-09 NOTE — ED Notes (Signed)
Pt transported to XRay 

## 2015-04-09 NOTE — Progress Notes (Signed)
ANTIBIOTIC CONSULT NOTE - INITIAL  Pharmacy Consult for vancomycin and zosyn Indication: rule out sepsis  No Known Allergies  Patient Measurements: Height: 4' 11.06" (150 cm) Weight: 111 lb 15.9 oz (50.8 kg) IBW/kg (Calculated) : 43.33  Vital Signs: Temp: 95.5 F (35.3 C) (10/05 1424) Temp Source: Rectal (10/05 1424) BP: 123/51 mmHg (10/05 1525) Pulse Rate: 75 (10/05 1525) Intake/Output from previous day:   Intake/Output from this shift:    Labs:  Recent Labs  04/09/15 1423  WBC 16.0*  HGB 14.3  PLT 173  CREATININE 1.01*   Estimated Creatinine Clearance: 33.4 mL/min (by C-G formula based on Cr of 1.01). No results for input(s): VANCOTROUGH, VANCOPEAK, VANCORANDOM, GENTTROUGH, GENTPEAK, GENTRANDOM, TOBRATROUGH, TOBRAPEAK, TOBRARND, AMIKACINPEAK, AMIKACINTROU, AMIKACIN in the last 72 hours.   Microbiology: No results found for this or any previous visit (from the past 720 hour(s)).  Assessment: 74 yo female presenting with N/V/D. No fever, chills, urinary symptoms  PMH: DM, HLD, HTN, Hx of Latent TB  ID: abx for sepsis of unk. Source. Temp 95.5. WBC 16, Lac 4.33  Vanc 10/5>> Zosyn 10/5>>  Blood 10/5>> Urine  Renal: SCr 1.01  Goal of Therapy:  Vancomycin trough level 15-20 mcg/ml  Plan:  Zosyn 3.375 gm IV q8h Vancomycin 1 g x 1 then 750 mg IV q24h Monitor clinical status, renal function, culture results, VT prn  Levester Fresh, PharmD, [facility] Clinical Pharmacist Pager [phone] 04/09/2015 3:32 PM

## 2015-04-09 NOTE — Progress Notes (Signed)
Received report from [place], ED nurse.

## 2015-04-09 NOTE — ED Notes (Signed)
Pt is actively vomiting at triage and does not speak english.  Speaks "[facility]".  Pt called daughter at 0100 for vomiting.  Another found her on the floor.  Body feels cool to touch. Pt told daughter started feeling bad around 1200.  Denies pain anywhere. PT knows she is at hospital.  CBg 119. Pt follows commands

## 2015-04-09 NOTE — ED Notes (Signed)
Attempted report 

## 2015-04-09 NOTE — H&P (Signed)
Date: 04/09/2015               Patient Name:  Connie Powell MRN: [id number]  DOB: [birth year] Age / Sex: 74 y.o., female   PCP: Norman Herrlich, MD         Medical Service: Internal Medicine Teaching Service         Attending Physician: Dr. Michel Bickers, MD    First Contact: Dr. Lindon Romp Pager: [phone]  Second Contact: Dr. Michail Jewels Pager: [phone]       After Hours (After 5p/  First Contact Pager: [phone]  weekends / holidays): Second Contact Pager: [phone]   Chief Complaint: "I had three episodes of watery diarrhea and vomiting earlier today and I feel weak."  History of Present Illness: Connie Powell is a friendly 74 year old African American non-English speaking lady from [place] with a history of hypertension, type 2 diabetes, and osteoarthritis who presents to the emergency department with acute onset nausea, vomiting, non-bloody, watery diarrhea, and lethargy that started abruptly earlier this morning. Prior to this, she was feeling quite well. She denies anybody else having similar symptoms, change in her diet, consumption of raw meat, recent travel, recent antibiotics, or similar episodes of these symptoms.  In the emergency department, she was slightly hypotensive to 100/50 without tachycardia, hypothermic 96, with a luekocytosis of 16. Her lactic acid was 4 which improved to 2 after 2 liters of normal saline. After getting fluids, she felt much better and her nausea and diarrhea resolved.  Meds: Current Facility-Administered Medications  Medication Dose Route Frequency Provider Last Rate Last Dose  . acetaminophen (TYLENOL) tablet 650 mg  650 mg Oral Q6H PRN Jones Bales, MD       Or  . acetaminophen (TYLENOL) suppository 650 mg  650 mg Rectal Q6H PRN Jones Bales, MD      . Derrill Memo ON 04/10/2015] calcium-vitamin D (OSCAL WITH D) 500-200 MG-UNIT per tablet 1 tablet  1 tablet Oral Daily Michel Bickers, MD      . enoxaparin (LOVENOX) injection 30 mg  30 mg Subcutaneous  Q24H Jones Bales, MD      . Derrill Memo ON 04/10/2015] Influenza vac split quadrivalent PF (FLUARIX) injection 0.5 mL  0.5 mL Intramuscular Tomorrow-1000 Michel Bickers, MD      . Derrill Memo ON 04/10/2015] piperacillin-tazobactam (ZOSYN) IVPB 3.375 g  3.375 g Intravenous Q8H Wynell Balloon, RPH      . potassium chloride 10 mEq in 100 mL IVPB  10 mEq Intravenous Q1 Hr x 4 Michel Bickers, MD   10 mEq at 04/09/15 2039  . [START ON 04/10/2015] vancomycin (VANCOCIN) IVPB 750 mg/150 ml premix  750 mg Intravenous Q24H Wynell Balloon, RPH        Allergies: Allergies as of 04/09/2015  . (No Known Allergies)   Past Medical History  Diagnosis Date  . Diabetes mellitus     history of last HA1C 05/2013 6.1, over the years been <6.5   . Hyperlipidemia   . Hypertension   . 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 Osteoporosis     Dexa scan 4/09, starting alendronate 6/09; DEXA 01/29/2013 with T score ranges -2.5 to -3.4 rec repeat DEXA in 1 yr  . 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 Chronic cough     with negative chest CT  . Abnormal Pap smear of cervix  hx of, CIN -1 on ECC  f/u with GYN in 02/2007  . LSIL (low grade squamous intraepithelial lesion) on Pap smear     04/2013    Past Surgical History  Procedure Laterality Date  . Cholecystectomy    . Cataract extraction  10/2011, 04/2012    both eyes   No family history on file. Social History   Social History  . Marital Status: Widowed    Spouse Name: N/A  . Number of Children: N/A  . Years of Education: N/A   Occupational History  . Not on file.   Social History Main Topics  . Smoking status: Never Smoker   . Smokeless tobacco: Never Used  . Alcohol Use: No  . Drug Use: No  . Sexual Activity: No   Other Topics Concern  . Not on file   Social History Narrative   Moved here from [place] in 2004.Lives with her daughter.    Review of Systems  Constitutional: Positive for chills and  malaise/fatigue. Negative for fever, weight loss and diaphoresis.  Eyes: Negative for blurred vision and double vision.  Respiratory: Negative for cough and shortness of breath.   Cardiovascular: Negative for chest pain and palpitations.  Gastrointestinal: Positive for nausea, vomiting and diarrhea. Negative for abdominal pain, blood in stool and melena.  Genitourinary: Negative for dysuria and urgency.  Musculoskeletal: Negative for myalgias.  Skin: Negative for rash.  Neurological: Positive for weakness. Negative for dizziness, loss of consciousness and headaches.    Physical Exam: Blood pressure 109/46, pulse 73, temperature 98.8 F (37.1 C), temperature source Oral, resp. rate 16, height 4\' 11"  (1.499 m), weight 50.6 kg (111 lb 8.8 oz), SpO2 99 %. Physical Exam  Constitutional: She appears well-developed and well-nourished.  HENT:  Head: Normocephalic and atraumatic.  Eyes: Conjunctivae and EOM are normal. Pupils are equal, round, and reactive to light.  Neck: Normal range of motion. Neck supple.  Cardiovascular: Normal rate, regular rhythm, normal heart sounds and intact distal pulses.   No murmur heard. Pulmonary/Chest: Effort normal and breath sounds normal.  Abdominal: Soft. Bowel sounds are normal. She exhibits no distension. There is no tenderness.  Neurological: She is alert.  Skin: Skin is warm and dry.  Psychiatric: She has a normal mood and affect. Her behavior is normal.   Lab results: Basic Metabolic Panel:  Recent Labs  04/09/15 1423  NA 137  K 3.1*  CL 101  CO2 22  GLUCOSE 138*  BUN 23*  CREATININE 1.01*  CALCIUM 9.7   Liver Function Tests:  Recent Labs  04/09/15 1423  AST 30  ALT 17  ALKPHOS 67  BILITOT 1.5*  PROT 7.9  ALBUMIN 4.4    Recent Labs  04/09/15 1423  LIPASE 36   No results for input(s): AMMONIA in the last 72 hours. CBC:  Recent Labs  04/09/15 1423  WBC 16.0*  HGB 14.3  HCT 41.9  MCV 90.9  PLT 173   CBG:  Recent  Labs  04/09/15 1357  GLUCAP 119*   Urinalysis:  Recent Labs  04/09/15 1617  COLORURINE YELLOW  LABSPEC 1.014  PHURINE 5.0  GLUCOSEU NEGATIVE  HGBUR NEGATIVE  BILIRUBINUR NEGATIVE  KETONESUR NEGATIVE  PROTEINUR NEGATIVE  UROBILINOGEN 0.2  NITRITE NEGATIVE  LEUKOCYTESUR NEGATIVE   Imaging results:  Dg Chest 2 View  04/09/2015   CLINICAL DATA:  Code sepsis.  Vomiting.  EXAM: CHEST  2 VIEW  COMPARISON:  04/16/2008  FINDINGS: Mild cardiomegaly. No confluent opacities or effusions.  No edema. No acute bony abnormality.  IMPRESSION: No active cardiopulmonary disease.   Electronically Signed   By: Rolm Baptise M.D.   On: 04/09/2015 16:14   Other results: EKG: Normal sinus rhythm, no ischemic changes.  Assessment & Plan by Problem: Ms. Rimmer is a friendly 74 year old African American lady presenting with acute onset nausea, vomiting, watery diarrhea, and hypothermia that has improved significantly with IV fluids, most consistent with viral gastroenteritis. She was eating crackers when we saw her and was feeling her normal self again. Her initial lactic acid was concerning but this normalized with fluids so we will stop trending them; I do not believe she is septic. We will discontinue vancomycin and Zosyn at this time, continue maintenance fluids, and hopefully she will continue to improve.  Viral gastroenteritis: Per above. -Continue normal saline at 100cc/hr -Ondansetron PRN -Replaced potassium  Hypertension: We will hold her home medications because she is slightly hypotensive. -Hold HCTZ 12.5mg  daily -Hold lisinopril 2.5mg  daily  Type 2 diabetes: Her sugars are well-controlled. -Holding metformin -ISS  Dispo: Disposition is deferred at this time, awaiting improvement of current medical problems.   The patient does have a current PCP Norman Herrlich, MD) and does need an [facility] hospital follow-up appointment after discharge.  The patient does not know have transportation  limitations that hinder transportation to clinic appointments.  Signed: Loleta Chance, MD 04/09/2015, 9:44 PM

## 2015-04-09 NOTE — Progress Notes (Addendum)
Admission Note  Patient arrived to floor in room 5W17 via stretcher from ED. Patient is from [place] and patient's granddaughter at bedside translating for patient. Patient alert and oriented X 4. Vitals signs  Oral temperature 98.8 F       Blood pressure 109/46       Pulse 73       RR 16       SpO2 99 % on room air. Denies pain. Skin intact, no pressure ulcer noted in sacral area. Telemetry monitor #07 with continuous pulse oximetry placed.  Patient's ID armband verified with patient/ family, and in place. Information packet given to patient/ family. Fall risk assessed, SR up X2, patient/ family able to verbalize understanding of risks associated with falls and to call nurse or staff to assist before getting out of bed. Patient/ family oriented to room and equipment. Call bell within reach.

## 2015-04-09 NOTE — ED Notes (Signed)
Pt removed from [facility].

## 2015-04-09 NOTE — ED Notes (Signed)
Pt is inappropriate to go to tele floor per bed placement, despite vital signs and improved lactic acid. Will contact admitting doctor about changing bed.

## 2015-04-09 NOTE — ED Provider Notes (Signed)
CSN: [id number]     Arrival date & time 04/09/15  1347 History   First MD Initiated Contact with Patient 04/09/15 1405     Chief Complaint  Patient presents with  . Emesis     (Consider location/radiation/quality/duration/timing/severity/associated sxs/prior Treatment) HPI Comments: Patient presents today with nausea, vomiting, and diarrhea.  Symptoms began today around noon.  She has had an estimated three episodes of vomiting and three episodes of diarrhea.  No blood in her emesis or blood in her stool.  She denies any abdominal or chest pain.  She states that she just feels a sour taste in her mouth.  She denies fever, chills, headache, urinary symptoms, focal weakness.  No recent foreign travel.  No known sick contacts.  No recent hospitalizations.  She has not taken anything for symptoms prior to arrival.  The history is provided by the patient. The history is limited by a language barrier. A language interpreter was used (daughter interpreted).    Past Medical History  Diagnosis Date  . Diabetes mellitus     history of last HA1C 05/2013 6.1, over the years been <6.5   . Hyperlipidemia   . Hypertension   . 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 Osteoporosis     Dexa scan 4/09, starting alendronate 6/09; DEXA 01/29/2013 with T score ranges -2.5 to -3.4 rec repeat DEXA in 1 yr  . 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 Chronic cough     with negative chest CT  . 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 LSIL (low grade squamous intraepithelial lesion) on Pap smear     04/2013    Past Surgical History  Procedure Laterality Date  . Cholecystectomy    . Cataract extraction  10/2011, 04/2012    both eyes   No family history on file. Social History  Substance Use Topics  . Smoking status: Never Smoker   . Smokeless tobacco: Never Used  . Alcohol Use: No   OB History    Gravida Para Term Preterm AB TAB  SAB Ectopic Multiple Living   9 7 7  2  2   4      Review of Systems  All other systems reviewed and are negative.     Allergies  Review of patient's allergies indicates no known allergies.  Home Medications   Prior to Admission medications   Medication Sig Start Date End Date Taking? Authorizing Provider  Calcium 1200-1000 MG-UNIT CHEW Chew 1 capsule by mouth daily. 01/16/14   Nino Glow McLean-Scocozza, MD  hydrochlorothiazide (MICROZIDE) 12.5 MG capsule Take 1 capsule (12.5 mg total) by mouth daily. 03/17/15   Norman Herrlich, MD  lisinopril (PRINIVIL,ZESTRIL) 2.5 MG tablet Take 1 tablet (2.5 mg total) by mouth daily. 03/17/15   Norman Herrlich, MD  metFORMIN (GLUCOPHAGE) 500 MG tablet Take 1 tablet (500 mg total) by mouth daily with breakfast. 03/17/15   Norman Herrlich, MD   BP 118/44 mmHg  Pulse 71  Temp(Src) 95.5 F (35.3 C) (Rectal)  Resp 15  SpO2 97% Physical Exam  Constitutional: She appears well-developed and well-nourished.  HENT:  Head: Normocephalic and atraumatic.  Mouth/Throat: Oropharynx is clear and moist.  Eyes: EOM are normal. Pupils are equal, round, and reactive to light.  Neck: Normal range of motion. Neck supple.  Cardiovascular: Normal rate, regular rhythm and normal heart sounds.   Pulmonary/Chest: Effort normal and breath sounds normal.  Abdominal: Soft. Bowel sounds are normal. She exhibits no distension and no mass. There is tenderness. There is no rebound and no guarding.  Large central abdominal scar Mild diffuse tenderness to palpation  Neurological: She is alert.  Skin: Skin is warm and dry.  Psychiatric: She has a normal mood and affect.  Nursing note and vitals reviewed.   ED Course  Procedures (including critical care time) Labs Review Labs Reviewed  CBG MONITORING, ED - Abnormal; Notable for the following:    Glucose-Capillary 119 (*)    All other components within normal limits  LIPASE, BLOOD  COMPREHENSIVE METABOLIC PANEL  CBC   URINALYSIS, ROUTINE W REFLEX MICROSCOPIC (NOT AT [facility])  I-STAT TROPOININ, ED    Imaging Review No results found. I have personally reviewed and evaluated these images and lab results as part of my medical decision-making.   EKG Interpretation None     4:44 PM Discussed with Internal Medicine Teaching Service.  She reports that she will come admit the patient.   MDM   Final diagnoses:  None   Patient presents today with nausea, vomiting, and diarrhea.  Onset of symptoms earlier today.  She was found to be hypothermic initially in the ED.  Patient placed on bear hugger.  Blood pressure stable.  Concern for sepsis initially, therefore, patient started on broad spectrum antibiotics.  Abdomen is soft with mild diffuse tenderness to palpation.   No rebound or guarding.  Therefore, do not feel imaging is indicated at this time.  Labs showing leukocytosis and elevated lactate.  Patient given IVF.  Patient admitted to Internal Medicine Teaching service for further management.      Hyman Bible, PA-C 04/11/15 2041  Sharlett Iles, MD 04/11/15 [phone]

## 2015-04-09 NOTE — ED Notes (Signed)
Activated code sepsis  

## 2015-04-10 ENCOUNTER — Encounter (HOSPITAL_COMMUNITY): Payer: Self-pay

## 2015-04-10 DIAGNOSIS — K529 Noninfective gastroenteritis and colitis, unspecified: Secondary | ICD-10-CM

## 2015-04-10 DIAGNOSIS — A084 Viral intestinal infection, unspecified: Principal | ICD-10-CM

## 2015-04-10 LAB — COMPREHENSIVE METABOLIC PANEL
ALT: 15 U/L (ref 14–54)
ANION GAP: 8 (ref 5–15)
AST: 22 U/L (ref 15–41)
Albumin: 2.9 g/dL — ABNORMAL LOW (ref 3.5–5.0)
Alkaline Phosphatase: 50 U/L (ref 38–126)
BUN: 14 mg/dL (ref 6–20)
CHLORIDE: 107 mmol/L (ref 101–111)
CO2: 23 mmol/L (ref 22–32)
Calcium: 7.9 mg/dL — ABNORMAL LOW (ref 8.9–10.3)
Creatinine, Ser: 0.91 mg/dL (ref 0.44–1.00)
GFR calc non Af Amer: >60 mL/min (ref >60)
Glucose, Bld: 86 mg/dL (ref 65–99)
POTASSIUM: 4.1 mmol/L (ref 3.5–5.1)
SODIUM: 138 mmol/L (ref 135–145)
Total Bilirubin: 1.2 mg/dL (ref 0.3–1.2)
Total Protein: 5.4 g/dL — ABNORMAL LOW (ref 6.5–8.1)

## 2015-04-10 LAB — URINE CULTURE

## 2015-04-10 LAB — DIFFERENTIAL
BASOS ABS: 0 10*3/uL (ref 0.0–0.1)
Basophils Relative: 0 %
Eosinophils Absolute: 0 10*3/uL (ref 0.0–0.7)
Eosinophils Relative: 0 %
LYMPHS PCT: 14 %
Lymphs Abs: 2.2 10*3/uL (ref 0.7–4.0)
MONO ABS: 0.7 10*3/uL (ref 0.1–1.0)
MONOS PCT: 5 %
NEUTROS ABS: 12.4 10*3/uL — AB (ref 1.7–7.7)
Neutrophils Relative %: 81 %

## 2015-04-10 LAB — CBC WITH DIFFERENTIAL/PLATELET
BASOS ABS: 0 10*3/uL (ref 0.0–0.1)
Basophils Relative: 0 %
Eosinophils Absolute: 0 10*3/uL (ref 0.0–0.7)
Eosinophils Relative: 0 %
HCT: 30.8 % — ABNORMAL LOW (ref 36.0–46.0)
HEMOGLOBIN: 10.8 g/dL — AB (ref 12.0–15.0)
LYMPHS ABS: 1.2 10*3/uL (ref 0.7–4.0)
LYMPHS PCT: 17 %
MCH: 31.8 pg (ref 26.0–34.0)
MCHC: 35.1 g/dL (ref 30.0–36.0)
MCV: 90.6 fL (ref 78.0–100.0)
Monocytes Absolute: 0.3 10*3/uL (ref 0.1–1.0)
Monocytes Relative: 4 %
NEUTROS ABS: 5.5 10*3/uL (ref 1.7–7.7)
NEUTROS PCT: 79 %
Platelets: 122 10*3/uL — ABNORMAL LOW (ref 150–400)
RBC: 3.4 MIL/uL — AB (ref 3.87–5.11)
RDW: 13 % (ref 11.5–15.5)
WBC: 7 10*3/uL (ref 4.0–10.5)

## 2015-04-10 LAB — GLUCOSE, CAPILLARY
GLUCOSE-CAPILLARY: 88 mg/dL (ref 65–99)
GLUCOSE-CAPILLARY: 127 mg/dL — AB (ref 65–99)

## 2015-04-10 LAB — PROCALCITONIN: Procalcitonin: 4.4 ng/mL

## 2015-04-10 LAB — MAGNESIUM: Magnesium: 1.8 mg/dL (ref 1.7–2.4)

## 2015-04-10 MED ORDER — INFLUENZA VAC SPLIT QUAD 0.5 ML IM SUSY
0.5000 mL | PREFILLED_SYRINGE | INTRAMUSCULAR | Status: AC | PRN
  Administered 2015-04-10: 0.5 mL via INTRAMUSCULAR
  Filled 2015-04-10: qty 0.5

## 2015-04-10 MED ORDER — MAGNESIUM SULFATE 2 GM/50ML IV SOLN
2.0000 g | Freq: Once | INTRAVENOUS | Status: AC
  Administered 2015-04-10: 2 g via INTRAVENOUS
  Filled 2015-04-10: qty 50

## 2015-04-10 NOTE — Care Management Note (Addendum)
Case Management Note  Patient Details  Name: Connie Powell MRN: [id number] Date of Birth: 12-Jan-1941  Subjective/Objective:                 Patient admitted from home with gastroenteritis, dehydration. Receiving IV Abx, IVF, antiemetics. Lives in apartment with granddaughter.   Action/Plan:  Anticipate DC to home with granddaughter today, No CM needs identified.     Expected Discharge Date:  04/11/15               Expected Discharge Plan:  Home/Self Care  In-House Referral:     Discharge planning Services  CM Consult  Post Acute Care Choice:    Choice offered to:     DME Arranged:    DME Agency:     HH Arranged:    HH Agency:     Status of Service:  In process, will continue to follow  Medicare Important Message Given:    Date Medicare IM Given:    Medicare IM give by:    Date Additional Medicare IM Given:    Additional Medicare Important Message give by:     If discussed at [place] of Stay Meetings, dates discussed:    Additional Comments:  Carles Collet, RN 04/10/2015, 9:02 AM

## 2015-04-10 NOTE — Discharge Summary (Signed)
Name: Connie Powell MRN: [id number] DOB: [birth year] 74 y.o. PCP: Norman Herrlich, MD  Date of Admission: 04/09/2015  2:03 PM Date of Discharge: 04/10/2015 Attending Physician: Michel Bickers, MD  Discharge Diagnosis: 1. Viral Gastroenteritis   Principal Problem:   Gastroenteritis Active Problems:   Diabetes mellitus type 1, controlled, without complications ([place])   Hyperlipidemia   Essential hypertension  Discharge Medications:   Medication List    TAKE these medications        CALCIUM 1200-1000 MG-UNIT Chew  Chew 1 capsule by mouth daily.     hydrochlorothiazide 12.5 MG capsule  Commonly known as:  MICROZIDE  Take 1 capsule (12.5 mg total) by mouth daily.     lisinopril 2.5 MG tablet  Commonly known as:  PRINIVIL,ZESTRIL  Take 1 tablet (2.5 mg total) by mouth daily.     metFORMIN 500 MG tablet  Commonly known as:  GLUCOPHAGE  Take 1 tablet (500 mg total) by mouth daily with breakfast.        Disposition and follow-up:   Connie Powell was discharged from [facility] in Good condition.  At the hospital follow up visit please address:  1.  Hydration, electrolytes  2.  Labs / imaging needed at time of follow-up: BMP  3.  Pending labs/ test needing follow-up: none  Follow-up Appointments: Follow-up Information    Follow up with Julious Oka, MD On 04/17/2015.   Specialty:  Internal Medicine   Why:  10:45am   Contact information:   [place] [id number] [phone]       Discharge Instructions: Discharge Instructions    Call MD for:  extreme fatigue    Complete by:  As directed      Call MD for:  persistant dizziness or light-headedness    Complete by:  As directed      Call MD for:  persistant nausea and vomiting    Complete by:  As directed      Call MD for:  temperature >100.4    Complete by:  As directed      Diet - low sodium heart healthy    Complete by:  As directed      Increase activity slowly    Complete  by:  As directed            Consultations:    Procedures Performed:  Dg Chest 2 View  04/09/2015   CLINICAL DATA:  Code sepsis.  Vomiting.  EXAM: CHEST  2 VIEW  COMPARISON:  04/16/2008  FINDINGS: Mild cardiomegaly. No confluent opacities or effusions. No edema. No acute bony abnormality.  IMPRESSION: No active cardiopulmonary disease.   Electronically Signed   By: Rolm Baptise M.D.   On: 04/09/2015 16:14    2D Echo: none  Cardiac Cath: none  Admission HPI: Connie Powell is a friendly 74 year old African American non-English speaking lady from [place] with a history of hypertension, type 2 diabetes, and osteoarthritis who presents to the emergency department with acute onset nausea, vomiting, non-bloody, watery diarrhea, and lethargy that started abruptly earlier this morning. Prior to this, she was feeling quite well. She denies anybody else having similar symptoms, change in her diet, consumption of raw meat, recent travel, recent antibiotics, or similar episodes of these symptoms.  In the emergency department, she was slightly hypotensive to 100/50 without tachycardia, hypothermic 96, with a luekocytosis of 16. Her lactic acid was 4 which improved to 2 after 2 liters of normal  saline. After getting fluids, she felt much better and her nausea and diarrhea resolved.  Hospital Course by problem list: Principal Problem:   Gastroenteritis Active Problems:   Diabetes mellitus type 1, controlled, without complications ([place])   Hyperlipidemia   Essential hypertension   1. Viral Gastroenteritis: Patient's lactate trended down with IVF resuscitation. She was able to eat crackers on the day of presentation, and tolerated a full diet on the day of discharge.  She denied fever, chills, nausea, or vomiting. She did have a loose BM the morning of discharge, but does not say that it was diarrhea. She had no other complaints and felt safe for discharge.  2. HTN: Patient's blood pressure medications were  held on day of presentation due to hypotension. They were resumed on day of discharge.  3. DMII: Patient's metformin was held while admitted. She did not require insulin overnight.  Discharge Vitals:   BP 114/47 mmHg  Pulse 63  Temp(Src) 98.9 F (37.2 C) (Oral)  Resp 15  Ht 4\' 11"  (1.499 m)  Wt 112 lb 14.4 oz (51.211 kg)  BMI 22.79 kg/m2  SpO2 100%  Discharge Labs:  Results for orders placed or performed during the hospital encounter of 04/09/15 (from the past 24 hour(s))  CBG monitoring, ED     Status: Abnormal   Collection Time: 04/09/15  1:57 PM  Result Value Ref Range   Glucose-Capillary 119 (H) 65 - 99 mg/dL  Lipase, blood     Status: None   Collection Time: 04/09/15  2:23 PM  Result Value Ref Range   Lipase 36 22 - 51 U/L  Comprehensive metabolic panel     Status: Abnormal   Collection Time: 04/09/15  2:23 PM  Result Value Ref Range   Sodium 137 135 - 145 mmol/L   Potassium 3.1 (L) 3.5 - 5.1 mmol/L   Chloride 101 101 - 111 mmol/L   CO2 22 22 - 32 mmol/L   Glucose, Bld 138 (H) 65 - 99 mg/dL   BUN 23 (H) 6 - 20 mg/dL   Creatinine, Ser 1.01 (H) 0.44 - 1.00 mg/dL   Calcium 9.7 8.9 - 10.3 mg/dL   Total Protein 7.9 6.5 - 8.1 g/dL   Albumin 4.4 3.5 - 5.0 g/dL   AST 30 15 - 41 U/L   ALT 17 14 - 54 U/L   Alkaline Phosphatase 67 38 - 126 U/L   Total Bilirubin 1.5 (H) 0.3 - 1.2 mg/dL   GFR calc non Af Amer 53 (L) >60 mL/min   GFR calc Af Amer >60 >60 mL/min   Anion gap 14 5 - 15  CBC     Status: Abnormal   Collection Time: 04/09/15  2:23 PM  Result Value Ref Range   WBC 16.0 (H) 4.0 - 10.5 K/uL   RBC 4.61 3.87 - 5.11 MIL/uL   Hemoglobin 14.3 12.0 - 15.0 g/dL   HCT 41.9 36.0 - 46.0 %   MCV 90.9 78.0 - 100.0 fL   MCH 31.0 26.0 - 34.0 pg   MCHC 34.1 30.0 - 36.0 g/dL   RDW 12.7 11.5 - 15.5 %   Platelets 173 150 - 400 K/uL  Differential     Status: Abnormal   Collection Time: 04/09/15  2:23 PM  Result Value Ref Range   Neutrophils Relative % 81 %   Neutro Abs 12.4  (H) 1.7 - 7.7 K/uL   Lymphocytes Relative 14 %   Lymphs Abs 2.2 0.7 - 4.0 K/uL  Monocytes Relative 5 %   Monocytes Absolute 0.7 0.1 - 1.0 K/uL   Eosinophils Relative 0 %   Eosinophils Absolute 0.0 0.0 - 0.7 K/uL   Basophils Relative 0 %   Basophils Absolute 0.0 0.0 - 0.1 K/uL  I-Stat Troponin, ED (not at [facility])     Status: None   Collection Time: 04/09/15  2:38 PM  Result Value Ref Range   Troponin i, poc 0.00 0.00 - 0.08 ng/mL   Comment 3          I-Stat CG4 Lactic Acid, ED     Status: Abnormal   Collection Time: 04/09/15  3:18 PM  Result Value Ref Range   Lactic Acid, Venous 4.33 (HH) 0.5 - 2.0 mmol/L   Comment NOTIFIED PHYSICIAN   Urinalysis, Routine w reflex microscopic (not at [facility])     Status: None   Collection Time: 04/09/15  4:17 PM  Result Value Ref Range   Color, Urine YELLOW YELLOW   APPearance CLEAR CLEAR   Specific Gravity, Urine 1.014 1.005 - 1.030   pH 5.0 5.0 - 8.0   Glucose, UA NEGATIVE NEGATIVE mg/dL   Hgb urine dipstick NEGATIVE NEGATIVE   Bilirubin Urine NEGATIVE NEGATIVE   Ketones, ur NEGATIVE NEGATIVE mg/dL   Protein, ur NEGATIVE NEGATIVE mg/dL   Urobilinogen, UA 0.2 0.0 - 1.0 mg/dL   Nitrite NEGATIVE NEGATIVE   Leukocytes, UA NEGATIVE NEGATIVE  Lactic acid, plasma     Status: Abnormal   Collection Time: 04/09/15  6:16 PM  Result Value Ref Range   Lactic Acid, Venous 2.2 (HH) 0.5 - 2.0 mmol/L  I-Stat CG4 Lactic Acid, ED     Status: Abnormal   Collection Time: 04/09/15  6:27 PM  Result Value Ref Range   Lactic Acid, Venous 2.10 (HH) 0.5 - 2.0 mmol/L   Comment NOTIFIED PHYSICIAN   Lactic acid, plasma     Status: None   Collection Time: 04/09/15  8:28 PM  Result Value Ref Range   Lactic Acid, Venous 1.8 0.5 - 2.0 mmol/L  Magnesium     Status: None   Collection Time: 04/09/15  8:28 PM  Result Value Ref Range   Magnesium 1.7 1.7 - 2.4 mg/dL  Procalcitonin     Status: None   Collection Time: 04/09/15  8:28 PM  Result Value Ref Range   Procalcitonin  4.40 ng/mL  Glucose, capillary     Status: Abnormal   Collection Time: 04/09/15 10:43 PM  Result Value Ref Range   Glucose-Capillary 159 (H) 65 - 99 mg/dL  Comprehensive metabolic panel     Status: Abnormal   Collection Time: 04/10/15  5:56 AM  Result Value Ref Range   Sodium 138 135 - 145 mmol/L   Potassium 4.1 3.5 - 5.1 mmol/L   Chloride 107 101 - 111 mmol/L   CO2 23 22 - 32 mmol/L   Glucose, Bld 86 65 - 99 mg/dL   BUN 14 6 - 20 mg/dL   Creatinine, Ser 0.91 0.44 - 1.00 mg/dL   Calcium 7.9 (L) 8.9 - 10.3 mg/dL   Total Protein 5.4 (L) 6.5 - 8.1 g/dL   Albumin 2.9 (L) 3.5 - 5.0 g/dL   AST 22 15 - 41 U/L   ALT 15 14 - 54 U/L   Alkaline Phosphatase 50 38 - 126 U/L   Total Bilirubin 1.2 0.3 - 1.2 mg/dL   GFR calc non Af Amer >60 >60 mL/min   GFR calc Af Amer >60 >60 mL/min  Anion gap 8 5 - 15  Magnesium     Status: None   Collection Time: 04/10/15  5:56 AM  Result Value Ref Range   Magnesium 1.8 1.7 - 2.4 mg/dL  CBC with Differential/Platelet     Status: Abnormal   Collection Time: 04/10/15  5:56 AM  Result Value Ref Range   WBC 7.0 4.0 - 10.5 K/uL   RBC 3.40 (L) 3.87 - 5.11 MIL/uL   Hemoglobin 10.8 (L) 12.0 - 15.0 g/dL   HCT 30.8 (L) 36.0 - 46.0 %   MCV 90.6 78.0 - 100.0 fL   MCH 31.8 26.0 - 34.0 pg   MCHC 35.1 30.0 - 36.0 g/dL   RDW 13.0 11.5 - 15.5 %   Platelets 122 (L) 150 - 400 K/uL   Neutrophils Relative % 79 %   Neutro Abs 5.5 1.7 - 7.7 K/uL   Lymphocytes Relative 17 %   Lymphs Abs 1.2 0.7 - 4.0 K/uL   Monocytes Relative 4 %   Monocytes Absolute 0.3 0.1 - 1.0 K/uL   Eosinophils Relative 0 %   Eosinophils Absolute 0.0 0.0 - 0.7 K/uL   Basophils Relative 0 %   Basophils Absolute 0.0 0.0 - 0.1 K/uL  Glucose, capillary     Status: None   Collection Time: 04/10/15  8:07 AM  Result Value Ref Range   Glucose-Capillary 88 65 - 99 mg/dL    Signed: Iline Oven, MD 04/10/2015, 10:25 AM    Services Ordered on Discharge: none Equipment Ordered on Discharge:  none

## 2015-04-10 NOTE — Progress Notes (Signed)
Subjective: NAEON.  She denies F/C, N/V, or abdominal pain.  She still has some loose stool, but denies frank diarrhea. She tolerated regular diet without complaint.  She feels good and that she is safe for discharge.  Objective: Vital signs in last 24 hours: Filed Vitals:   04/09/15 2008 04/09/15 2013 04/10/15 0503 04/10/15 0621  BP:  109/46 114/47   Pulse:  73 63   Temp:  98.8 F (37.1 C) 98.9 F (37.2 C)   TempSrc:  Oral Oral   Resp:  16 15   Height: 4\' 11"  (1.499 m)     Weight: 111 lb 8.8 oz (50.6 kg)   112 lb 14.4 oz (51.211 kg)  SpO2:  99% 100%    Weight change:   Intake/Output Summary (Last 24 hours) at 04/10/15 0944 Last data filed at 04/10/15 0912  Gross per 24 hour  Intake 3570.33 ml  Output    400 ml  Net 3170.33 ml   Physical Exam  Constitutional: She is oriented to person, place, and time and well-developed, well-nourished, and in no distress. No distress.  HENT:  Head: Normocephalic and atraumatic.  Eyes: EOM are normal. No scleral icterus.  Neck: No tracheal deviation present.  Cardiovascular: Normal rate, regular rhythm, normal heart sounds and intact distal pulses.   Pulmonary/Chest: Effort normal and breath sounds normal. No respiratory distress. She has no wheezes.  Abdominal: Soft. She exhibits no distension. There is no tenderness. There is no rebound and no guarding.  Musculoskeletal: She exhibits no edema.  Neurological: She is alert and oriented to person, place, and time.  Skin: Skin is warm and dry. No rash noted. She is not diaphoretic.    Lab Results: Basic Metabolic Panel:  Recent Labs Lab 04/09/15 1423 04/09/15 2028 04/10/15 0556  NA 137  --  138  K 3.1*  --  4.1  CL 101  --  107  CO2 22  --  23  GLUCOSE 138*  --  86  BUN 23*  --  14  CREATININE 1.01*  --  0.91  CALCIUM 9.7  --  7.9*  MG  --  1.7 1.8   Liver Function Tests:  Recent Labs Lab 04/09/15 1423 04/10/15 0556  AST 30 22  ALT 17 15  ALKPHOS 67 50  BILITOT  1.5* 1.2  PROT 7.9 5.4*  ALBUMIN 4.4 2.9*    Recent Labs Lab 04/09/15 1423  LIPASE 36   No results for input(s): AMMONIA in the last 168 hours. CBC:  Recent Labs Lab 04/09/15 1423 04/10/15 0556  WBC 16.0* 7.0  NEUTROABS 12.4* 5.5  HGB 14.3 10.8*  HCT 41.9 30.8*  MCV 90.9 90.6  PLT 173 122*   Cardiac Enzymes: No results for input(s): CKTOTAL, CKMB, CKMBINDEX, TROPONINI in the last 168 hours. BNP: No results for input(s): PROBNP in the last 168 hours. D-Dimer: No results for input(s): DDIMER in the last 168 hours. CBG:  Recent Labs Lab 04/09/15 1357 04/09/15 2243 04/10/15 0807  GLUCAP 119* 159* 88   Hemoglobin A1C: No results for input(s): HGBA1C in the last 168 hours. Fasting Lipid Panel: No results for input(s): CHOL, HDL, LDLCALC, TRIG, CHOLHDL, LDLDIRECT in the last 168 hours. Thyroid Function Tests: No results for input(s): TSH, T4TOTAL, FREET4, T3FREE, THYROIDAB in the last 168 hours. Coagulation: No results for input(s): LABPROT, INR in the last 168 hours. Anemia Panel: No results for input(s): VITAMINB12, FOLATE, FERRITIN, TIBC, IRON, RETICCTPCT in the last 168 hours. Urine Drug Screen:  Drugs of Abuse     Component Value Date/Time   LABOPIA NONE DETECTED 04/16/2008 1635   COCAINSCRNUR NONE DETECTED 04/16/2008 1635   LABBENZ NONE DETECTED 04/16/2008 1635   AMPHETMU NONE DETECTED 04/16/2008 1635   THCU NONE DETECTED 04/16/2008 1635   LABBARB  04/16/2008 1635    NONE DETECTED        DRUG SCREEN FOR MEDICAL PURPOSES ONLY.  IF CONFIRMATION IS NEEDED FOR ANY PURPOSE, NOTIFY LAB WITHIN 5 DAYS.    Alcohol Level: No results for input(s): ETH in the last 168 hours. Urinalysis:  Recent Labs Lab 04/09/15 1617  COLORURINE YELLOW  LABSPEC 1.014  PHURINE 5.0  GLUCOSEU NEGATIVE  HGBUR NEGATIVE  BILIRUBINUR NEGATIVE  KETONESUR NEGATIVE  PROTEINUR NEGATIVE  UROBILINOGEN 0.2  NITRITE NEGATIVE  LEUKOCYTESUR NEGATIVE   Misc. Labs:   Micro  Results: No results found for this or any previous visit (from the past 240 hour(s)). Studies/Results: Dg Chest 2 View  04/09/2015   CLINICAL DATA:  Code sepsis.  Vomiting.  EXAM: CHEST  2 VIEW  COMPARISON:  04/16/2008  FINDINGS: Mild cardiomegaly. No confluent opacities or effusions. No edema. No acute bony abnormality.  IMPRESSION: No active cardiopulmonary disease.   Electronically Signed   By: Rolm Baptise M.D.   On: 04/09/2015 16:14   Medications: I have reviewed the patient's current medications. Scheduled Meds: . calcium-vitamin D  1 tablet Oral Daily  . enoxaparin (LOVENOX) injection  30 mg Subcutaneous Q24H  . insulin aspart  0-9 Units Subcutaneous TID WC   Continuous Infusions: . sodium chloride 100 mL/hr at 04/09/15 2206   PRN Meds:.acetaminophen **OR** acetaminophen, Influenza vac split quadrivalent PF, Influenza vac split quadrivalent PF, ondansetron Assessment/Plan: Principal Problem:   Gastroenteritis Active Problems:   Essential hypertension   Diabetes type 2, controlled ([place])   Sepsis ([place])  Connie Powell is a 74 year old presenting with acute onset nausea, vomiting, watery diarrhea, and hypothermia consistent with viral gastroenteritis.   Viral gastroenteritis: Symptoms resolved overnight with IVF.  Patient tolerated PO diet without nausea, vomiting, or abdominal pain.  She has some remaining loose stool, but is otherwise without complaint.  She feels safe for discharge. -Stop IVF -Ondansetron PRN  Hypertension: Home medications because she was slightly hypotensive on presentation.  Will resume upon discharge. -HCTZ 12.5mg  daily -Lisinopril 2.5mg  daily  Type 2 diabetes: Her sugars are well-controlled. -Resume metformin on discharge -ISS  Dispo: Disposition is deferred at this time, awaiting improvement of current medical problems.  Anticipated discharge in approximately 1 day(s).   The patient does have a current PCP Norman Herrlich, MD) and does need an [facility]  hospital follow-up appointment after discharge.  The patient does not have transportation limitations that hinder transportation to clinic appointments.  .Services Needed at time of discharge: Y = Yes, Blank = No PT:   OT:   RN:   Equipment:   Other:     LOS: 1 day   Iline Oven, MD 04/10/2015, 9:44 AM

## 2015-04-10 NOTE — Progress Notes (Signed)
Herling, Connie Powell is to be D/C'd Home per MD order. Discussed with the patient and all questions fully answered.  VSS, Skin clean, dry and intact without evidence of skin break down, no evidence of skin tears noted. IV catheter discontinued intact. Site without signs and symptoms of complications. Dressing and pressure applied.  An After Visit Summary was printed and given to the patient.   D/c education completed with patient/family including follow up instructions, medication list, d/c activities limitations if indicated, with other d/c instructions as indicated by MD - patient able to verbalize understanding, all questions fully answered.   Patient instructed to return to ED, call 911, or call MD for any changes in condition.   Patient escorted via walking with nurse tech, and D/C home via private auto with significant other.

## 2015-04-10 NOTE — Discharge Instructions (Signed)
1. Stay hydrated with fluids by mouth. 2. Follow up with Internal Medicine Clinic 04/17/15 at 10:45am.

## 2015-04-10 NOTE — H&P (Signed)
   Date: 04/10/2015  Patient name: Keani Gotcher  Medical record number: [id number]  Date of birth: 03-26-1941   I have seen and evaluated Jackqulyn Livings and discussed their care with the Residency Team.   Assessment and Plan: I have seen and evaluated the patient as outlined above. I agree with the formulated Assessment and Plan as detailed in the residents' admission note. Ms. Riggins at the acute onset of nausea, vomiting and diarrhea yesterday morning leading to admission. She received some IV fluids and one dose of antibiotics in the emergency department. This morning she feels well. She was able to either her full breakfast. She's had no further nausea or vomiting. She had one soft bowel movement today but no more diarrhea. She feels up to going home. Her granddaughter who is present and translates for her states that she appears back to normal and is eager for discharge.  Michel Bickers, MD 10/6/201611:50 AM

## 2015-04-11 ENCOUNTER — Encounter: Payer: Self-pay | Admitting: *Deleted

## 2015-04-11 ENCOUNTER — Telehealth: Payer: Self-pay | Admitting: Internal Medicine

## 2015-04-11 NOTE — Telephone Encounter (Signed)
Pt's  LOV for EYE EXAM with [facility] being faxed from MAY 2016.

## 2015-04-14 LAB — CULTURE, BLOOD (ROUTINE X 2)
Culture: NO GROWTH
Culture: NO GROWTH

## 2015-04-17 ENCOUNTER — Ambulatory Visit: Payer: Self-pay | Admitting: Internal Medicine

## 2015-06-04 ENCOUNTER — Ambulatory Visit (INDEPENDENT_AMBULATORY_CARE_PROVIDER_SITE_OTHER): Payer: Self-pay | Admitting: Internal Medicine

## 2015-06-04 ENCOUNTER — Encounter: Payer: Self-pay | Admitting: Internal Medicine

## 2015-06-04 VITALS — BP 130/55 | HR 72 | Temp 98.3°F | Ht 59.0 in | Wt 113.9 lb

## 2015-06-04 DIAGNOSIS — R2231 Localized swelling, mass and lump, right upper limb: Secondary | ICD-10-CM

## 2015-06-04 DIAGNOSIS — M810 Age-related osteoporosis without current pathological fracture: Secondary | ICD-10-CM

## 2015-06-04 DIAGNOSIS — Z23 Encounter for immunization: Secondary | ICD-10-CM

## 2015-06-04 DIAGNOSIS — Z0000 Encounter for general adult medical examination without abnormal findings: Secondary | ICD-10-CM

## 2015-06-04 NOTE — Assessment & Plan Note (Signed)
Continue taking calcium. Instructed to take vit D 1000 units. Declines fosamax this visit. Can check a Vit D level at next visit.

## 2015-06-04 NOTE — Assessment & Plan Note (Signed)
Received prevnar 13 today. Given written number to Dr. Domenic Schwab office and reminded she is due for a pap smear next monh for follow up of LGSIL.

## 2015-06-04 NOTE — Progress Notes (Signed)
   Subjective:   Patient ID: Connie Powell female   DOB: 1941-01-18 74 y.o.   MRN: [id number]  HPI: [name] is a 74 y.o. w/ PMHx as outlined below who presents for hospital f/u. She was admitted from 10/5-6 for viral gastroenteritis. She denies n/v/d and abd pain. She has no complaints today.   Please see problem list for status of the pt's chronic medical problems.  Past Medical History  Diagnosis Date  . Diabetes mellitus     history of last HA1C 05/2013 6.1, over the years been <6.5   . Hyperlipidemia   . Hypertension   . 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 Osteoporosis     Dexa scan 4/09, starting alendronate 6/09; DEXA 01/29/2013 with T score ranges -2.5 to -3.4 rec repeat DEXA in 1 yr  . 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 Chronic cough     with negative chest CT  . 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 LSIL (low grade squamous intraepithelial lesion) on Pap smear     04/2013    Current Outpatient Prescriptions  Medication Sig Dispense Refill  . Calcium 1200-1000 MG-UNIT CHEW Chew 1 capsule by mouth daily. 90 each 3  . hydrochlorothiazide (MICROZIDE) 12.5 MG capsule Take 1 capsule (12.5 mg total) by mouth daily. 90 capsule 3  . lisinopril (PRINIVIL,ZESTRIL) 2.5 MG tablet Take 1 tablet (2.5 mg total) by mouth daily. 90 tablet 3  . metFORMIN (GLUCOPHAGE) 500 MG tablet Take 1 tablet (500 mg total) by mouth daily with breakfast. 90 tablet 3   No current facility-administered medications for this visit.   No family history on file. Social History   Social History  . Marital Status: Widowed    Spouse Name: N/A  . Number of Children: N/A  . Years of Education: N/A   Social History Main Topics  . Smoking status: Never Smoker   . Smokeless tobacco: Never Used  . Alcohol Use: No  . Drug Use: No  . Sexual Activity: No   Other Topics Concern  . None   Social History Narrative   Moved here from [place] in 2004.Lives with her daughter.   Review of Systems: Review of Systems  Constitutional: Negative for fever, chills and malaise/fatigue.  Respiratory: Negative for shortness of breath.   Cardiovascular: Negative for chest pain.  Gastrointestinal: Negative for nausea, vomiting and abdominal pain.    Objective:  Physical Exam: Filed Vitals:   06/04/15 1602  BP: 130/55  Pulse: 72  Temp: 98.3 F (36.8 C)  TempSrc: Oral  Height: 4\' 11"  (1.499 m)  Weight: 113 lb 14.4 oz (51.665 kg)  SpO2: 100%   Physical Exam  Constitutional: She appears well-developed and well-nourished. No distress.  HENT:  Head: Normocephalic.  Mouth/Throat: Oropharynx is clear and moist. No oropharyngeal exudate.  Cardiovascular: Normal rate, regular rhythm and normal heart sounds.   No murmur heard. Pulmonary/Chest: Effort normal and breath sounds normal. No respiratory distress. She has no wheezes.  Abdominal: Soft. Bowel sounds are normal. She exhibits no distension. There is no tenderness. There is no rebound and no guarding.  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   Assessment & Plan:   Please see problem based assessment and plan.

## 2015-06-04 NOTE — Assessment & Plan Note (Addendum)
Pt is unable to get an appt with surgery this year due to repeated no shows. Will place a referral for general sx at next visit in 2017. Pt states the rt axilla mass is unchanged and painless. Her primary transport is her daughter Connie Powell whose phone number is [phone]. Stressed importance of keeping her next appt when it is made.

## 2015-06-04 NOTE — Patient Instructions (Addendum)
Start taking vitamin D 1000 units everyday.

## 2015-06-05 NOTE — Progress Notes (Signed)
Case discussed with Dr. Truong at time of visit. We reviewed the resident's history and exam and pertinent patient test results. I agree with the assessment, diagnosis, and plan of care documented in the resident's note. 

## 2015-08-28 ENCOUNTER — Telehealth: Payer: Self-pay | Admitting: Internal Medicine

## 2015-08-28 NOTE — Telephone Encounter (Signed)
APPT REMINDER CALL, LMTCB IF SHE NEEDS TO CANCEL °

## 2015-08-29 ENCOUNTER — Ambulatory Visit: Payer: Self-pay

## 2015-10-30 ENCOUNTER — Telehealth: Payer: Self-pay | Admitting: Internal Medicine

## 2015-10-30 NOTE — Telephone Encounter (Signed)
APPT. REMINDER CALL, LMTCB °

## 2015-10-31 ENCOUNTER — Ambulatory Visit: Payer: Self-pay | Admitting: Internal Medicine

## 2015-11-06 ENCOUNTER — Encounter: Payer: Self-pay | Admitting: Internal Medicine

## 2015-11-13 ENCOUNTER — Encounter: Payer: Self-pay | Admitting: Internal Medicine

## 2015-11-27 ENCOUNTER — Ambulatory Visit (INDEPENDENT_AMBULATORY_CARE_PROVIDER_SITE_OTHER): Payer: Self-pay | Admitting: Internal Medicine

## 2015-11-27 ENCOUNTER — Encounter: Payer: Self-pay | Admitting: Internal Medicine

## 2015-11-27 VITALS — BP 138/64 | HR 78 | Temp 98.2°F | Wt 111.6 lb

## 2015-11-27 DIAGNOSIS — I10 Essential (primary) hypertension: Secondary | ICD-10-CM

## 2015-11-27 DIAGNOSIS — E119 Type 2 diabetes mellitus without complications: Secondary | ICD-10-CM

## 2015-11-27 DIAGNOSIS — R2231 Localized swelling, mass and lump, right upper limb: Secondary | ICD-10-CM

## 2015-11-27 DIAGNOSIS — Z7984 Long term (current) use of oral hypoglycemic drugs: Secondary | ICD-10-CM

## 2015-11-27 DIAGNOSIS — M810 Age-related osteoporosis without current pathological fracture: Secondary | ICD-10-CM

## 2015-11-27 LAB — POCT GLYCOSYLATED HEMOGLOBIN (HGB A1C): HEMOGLOBIN A1C: 7

## 2015-11-27 LAB — GLUCOSE, CAPILLARY: GLUCOSE-CAPILLARY: 142 mg/dL — AB (ref 65–99)

## 2015-11-27 NOTE — Assessment & Plan Note (Addendum)
BP Readings from Last 3 Encounters:  11/27/15 138/64  06/04/15 130/55  04/10/15 114/47    Lab Results  Component Value Date   NA 138 04/10/2015   K 4.1 04/10/2015   CREATININE 0.91 04/10/2015    Assessment: Blood pressure control:  controlled Progress toward BP goal:    unchanged Comments: on lisinopril 2.5mg  and HCTZ 12.5mg  daily  Plan: Medications:  continue current medications Educational resources provided:   Self management tools provided:   Other plans: f/u in 6 months for BP check. Checking BMET today.

## 2015-11-27 NOTE — Progress Notes (Signed)
Internal Medicine Clinic Attending  Case discussed with Dr. Truong at the time of the visit.  We reviewed the resident's history and exam and pertinent patient test results.  I agree with the assessment, diagnosis, and plan of care documented in the resident's note.  

## 2015-11-27 NOTE — Progress Notes (Signed)
Subjective:   Patient ID: Connie Powell female   DOB: 07-11-1940 75 y.o.   MRN: [id number]  HPI: [name] is a 75 y.o. with past medical history as outlined below who presents to clinic for HTN and DM follow up.   Please see problem list for status of the pt's chronic medical problems.  Past Medical History  Diagnosis Date  . Diabetes mellitus     history of last HA1C 05/2013 6.1, over the years been <6.5   . Hyperlipidemia   . Hypertension   . 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 Osteoporosis     Dexa scan 4/09, starting alendronate 6/09; DEXA 01/29/2013 with T score ranges -2.5 to -3.4 rec repeat DEXA in 1 yr  . 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 Chronic cough     with negative chest CT  . 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 LSIL (low grade squamous intraepithelial lesion) on Pap smear     04/2013    Current Outpatient Prescriptions  Medication Sig Dispense Refill  . Calcium 1200-1000 MG-UNIT CHEW Chew 1 capsule by mouth daily. 90 each 3  . hydrochlorothiazide (MICROZIDE) 12.5 MG capsule Take 1 capsule (12.5 mg total) by mouth daily. 90 capsule 3  . lisinopril (PRINIVIL,ZESTRIL) 2.5 MG tablet Take 1 tablet (2.5 mg total) by mouth daily. 90 tablet 3  . metFORMIN (GLUCOPHAGE) 500 MG tablet Take 1 tablet (500 mg total) by mouth daily with breakfast. 90 tablet 3   No current facility-administered medications for this visit.   No family history on file. Social History   Social History  . Marital Status: Widowed    Spouse Name: N/A  . Number of Children: N/A  . Years of Education: N/A   Social History Main Topics  . Smoking status: Never Smoker   . Smokeless tobacco: Never Used  . Alcohol Use: No  . Drug Use: No  . Sexual Activity: No   Other Topics Concern  . Not on file   Social History Narrative   Moved here from [place] in 2004.Lives with her daughter.   Review of  Systems: Review of Systems  Constitutional: Negative for fever, chills and weight loss.  Eyes: Negative.   Respiratory: Negative for shortness of breath.   Cardiovascular: Negative for chest pain.  Genitourinary: Positive for frequency. Negative for dysuria.  Neurological: Negative for weakness and headaches.  Endo/Heme/Allergies: Negative for polydipsia.    Objective:  Physical Exam: Filed Vitals:   11/27/15 1332 11/27/15 1355  BP: 162/59 138/64  Pulse: 90 78  Temp: 98.2 F (36.8 C)   TempSrc: Oral   Weight: 111 lb 9.6 oz (50.621 kg)   SpO2: 100%    Physical Exam  Constitutional: She appears well-developed and well-nourished. No distress.  HENT:  Head: Normocephalic and atraumatic.  Nose: Nose normal.  Mouth/Throat: Oropharynx is clear and moist.  Eyes: Conjunctivae and EOM are normal.  Cardiovascular: Normal rate, regular rhythm and normal heart sounds.  Exam reveals no gallop and no friction rub.   No murmur heard. Pulmonary/Chest: Effort normal and breath sounds normal. No respiratory distress. She has no wheezes. She has no rales.  Abdominal: Soft. Bowel sounds are normal. She exhibits no distension and no mass. There is no tenderness. There is no rebound and no guarding.  Musculoskeletal: She exhibits no edema.  Rt axilla mass about 4 inches in diameter, firm, non mobile, non tender  Skin: Skin is warm and dry. No rash noted. She is not diaphoretic. No erythema. No pallor.   Assessment & Plan:   Please see problem based assessment and plan.

## 2015-11-27 NOTE — Assessment & Plan Note (Signed)
Placed referral for general sx for rt axilla schwanomma removal.

## 2015-11-27 NOTE — Assessment & Plan Note (Signed)
Checking Vit D level.

## 2015-11-27 NOTE — Assessment & Plan Note (Addendum)
Lab Results  Component Value Date   HGBA1C 7.0 11/27/2015   HGBA1C 6.8 03/17/2015   HGBA1C 6.6 09/05/2014     Assessment: Diabetes control:  controlled Progress toward A1C goal:    Comments: on metformin 500mg  daily.   Plan: Medications:  continue current medications Home glucose monitoring: none Instruction/counseling given: discussed diet Educational resources provided:   Self management tools provided:   Other plans: f/u in 6 months. Checking urine microalbumin, if elevated can increase lisinopril and decrease HCTZ

## 2015-11-28 LAB — BMP8+ANION GAP
Anion Gap: 18 mmol/L (ref 10.0–18.0)
BUN/Creatinine Ratio: 20 (ref 12–28)
BUN: 20 mg/dL (ref 8–27)
CALCIUM: 9.7 mg/dL (ref 8.7–10.3)
CO2: 23 mmol/L (ref 18–29)
Chloride: 98 mmol/L (ref 96–106)
Creatinine, Ser: 0.98 mg/dL (ref 0.57–1.00)
GFR, EST AFRICAN AMERICAN: 66 mL/min/{1.73_m2} (ref >59)
GFR, EST NON AFRICAN AMERICAN: 57 mL/min/{1.73_m2} — AB (ref >59)
Glucose: 149 mg/dL — ABNORMAL HIGH (ref 65–99)
POTASSIUM: 3.8 mmol/L (ref 3.5–5.2)
Sodium: 139 mmol/L (ref 134–144)

## 2015-11-28 LAB — MICROALBUMIN / CREATININE URINE RATIO
Creatinine, Urine: 113.6 mg/dL
MICROALB/CREAT RATIO: 8.7 mg/g{creat} (ref 0.0–30.0)
Microalbumin, Urine: 9.9 ug/mL

## 2015-11-28 LAB — VITAMIN D 25 HYDROXY (VIT D DEFICIENCY, FRACTURES): VIT D 25 HYDROXY: 35.8 ng/mL (ref 30.0–100.0)

## 2016-01-20 IMAGING — MG MM DIAG BREAST TOMO UNI RIGHT
9 series · 9 of 21 positions shown · non-contrast
Comparison: 02/06/2014 mammogram and ultrasound.

CLINICAL DATA: 73-year-old female with chronically enlarged right
axillary lymph node. Patient with history of latent pulmonary TB.

EXAM:
DIGITAL DIAGNOSTIC RIGHT MAMMOGRAM WITH 3D TOMOSYNTHESIS WITH CAD
ULTRASOUND RIGHT AXILLA

[R CC]
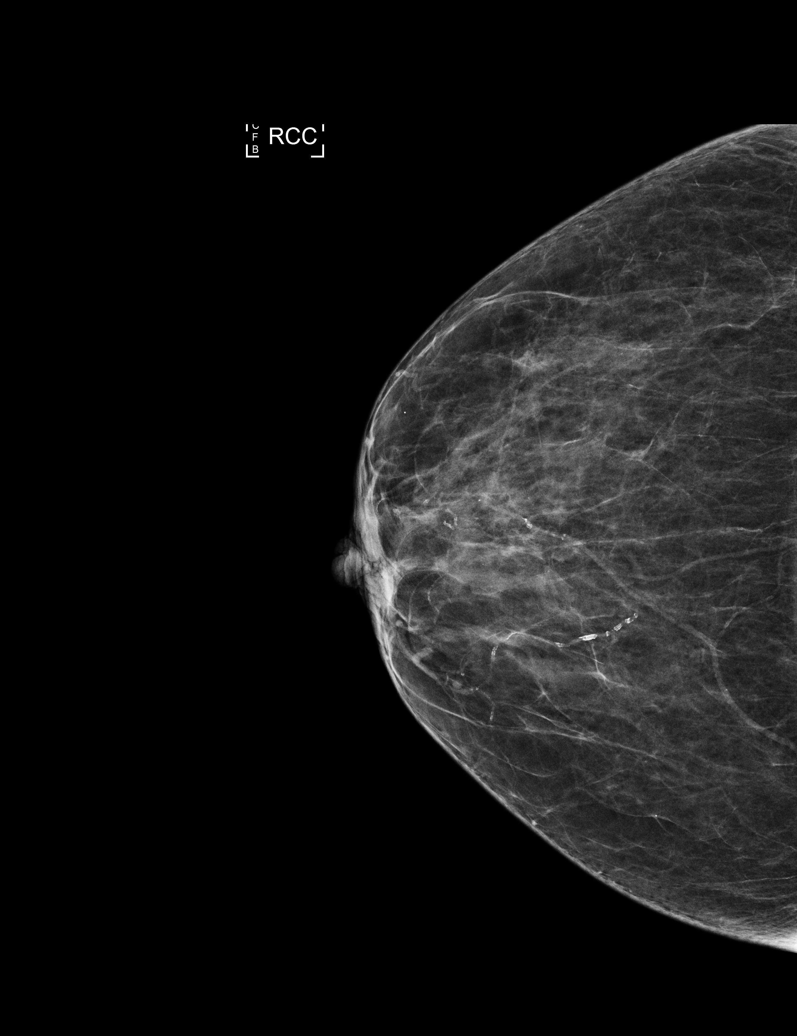

[R MLO (1 of 2)]
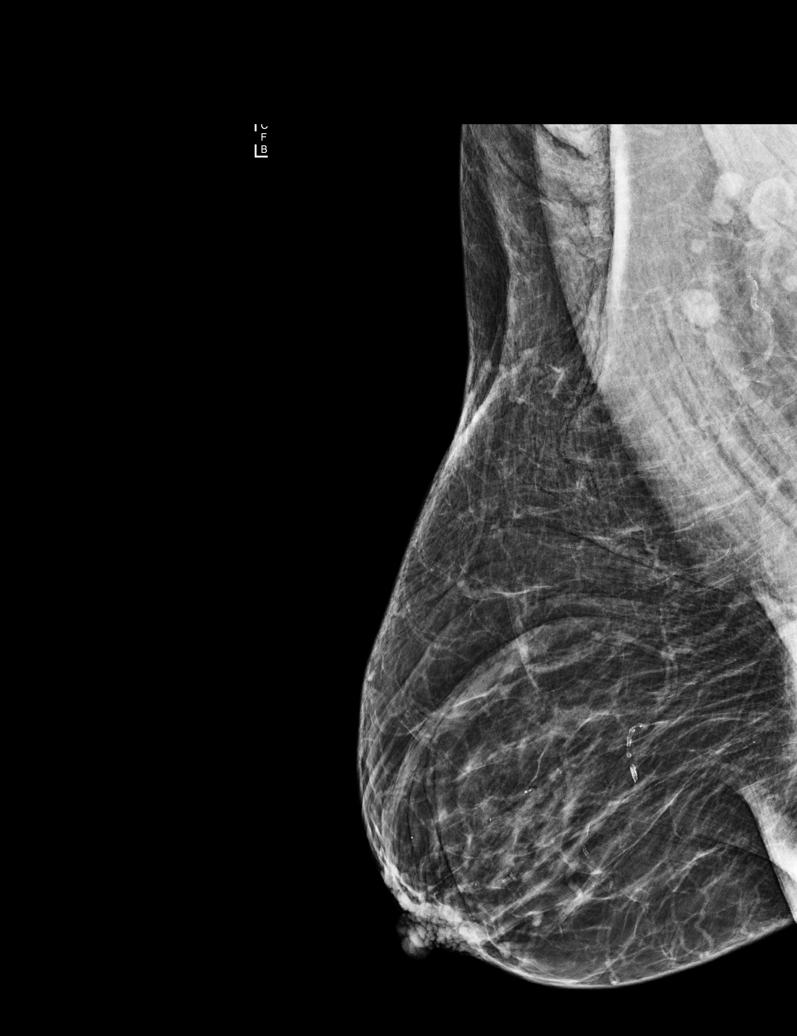

[R MLO (2 of 2)]
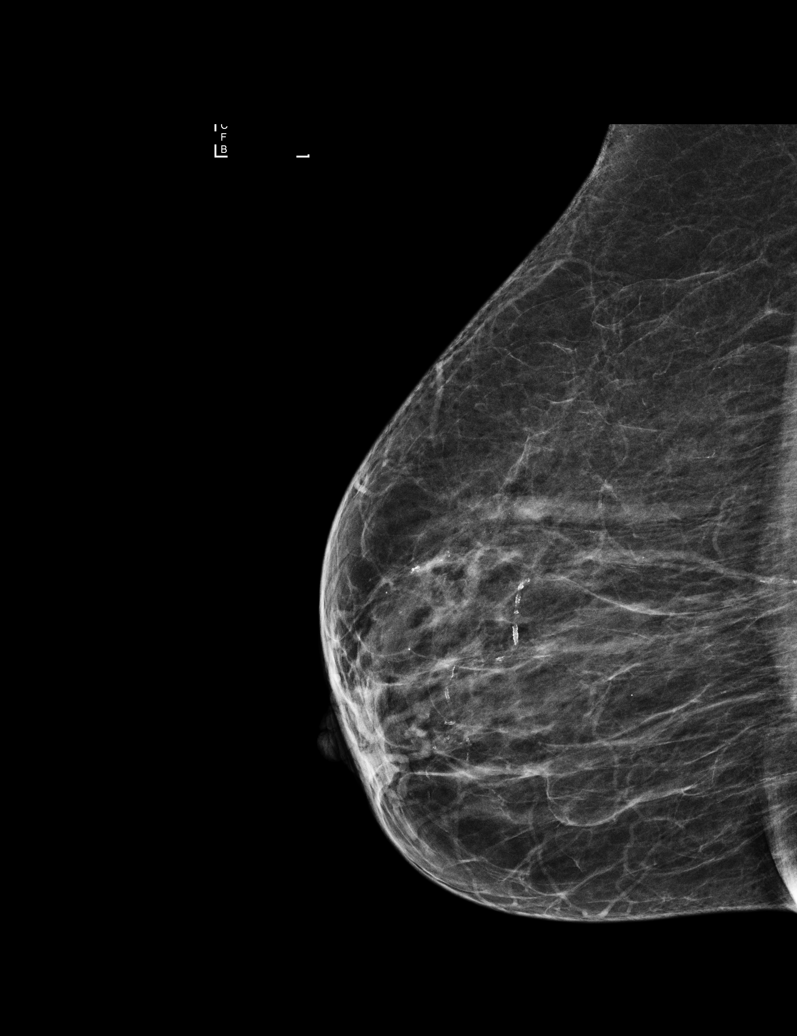

[R MLO tomo (1 of 4)]
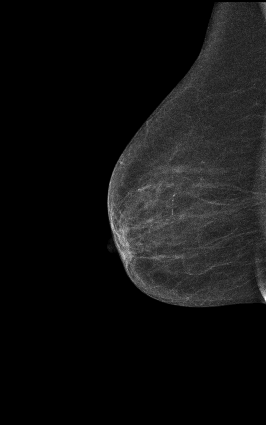

[R CC tomo (1 of 2)]
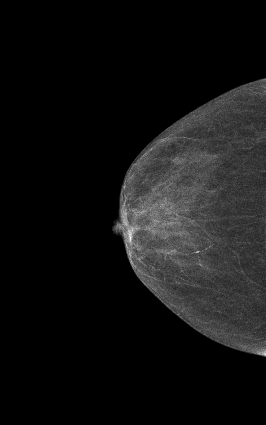

[R MLO tomo (2 of 4)]
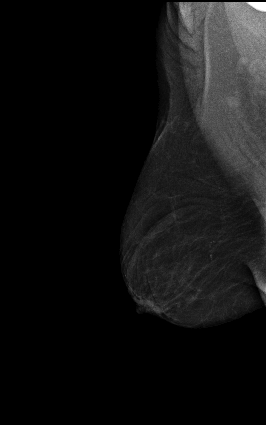

[R CC tomo (2 of 2) · tomo slice 14/27.0]
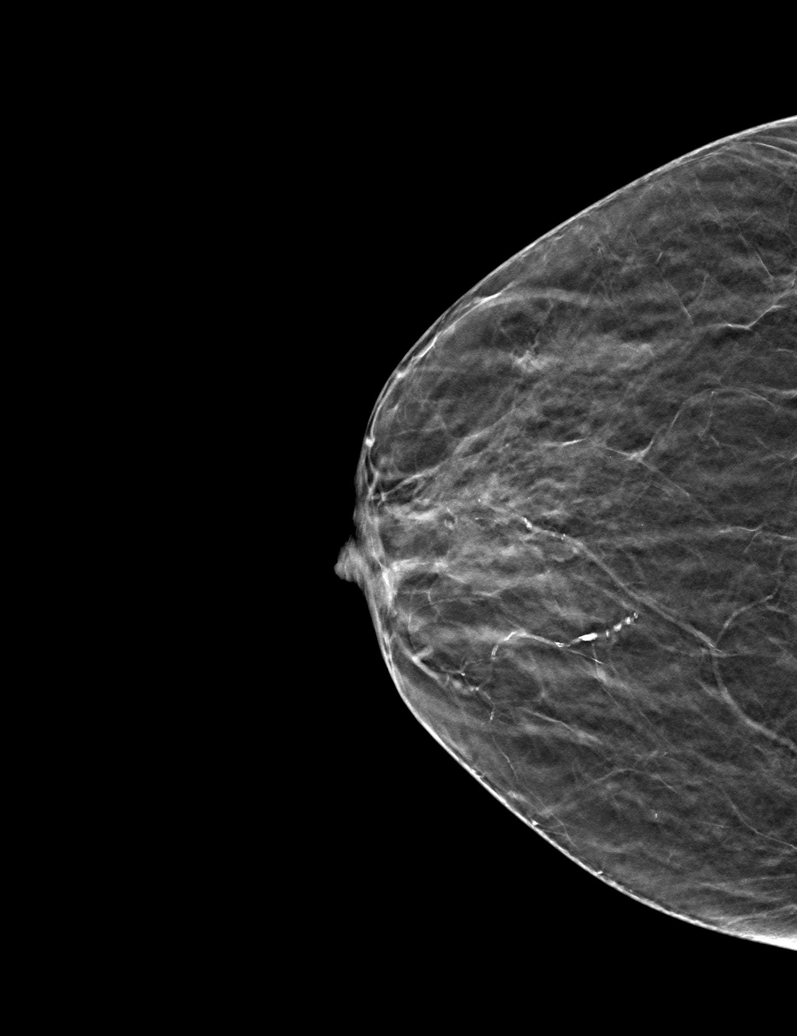

[R MLO tomo (3 of 4) · tomo slice 26/51.0]
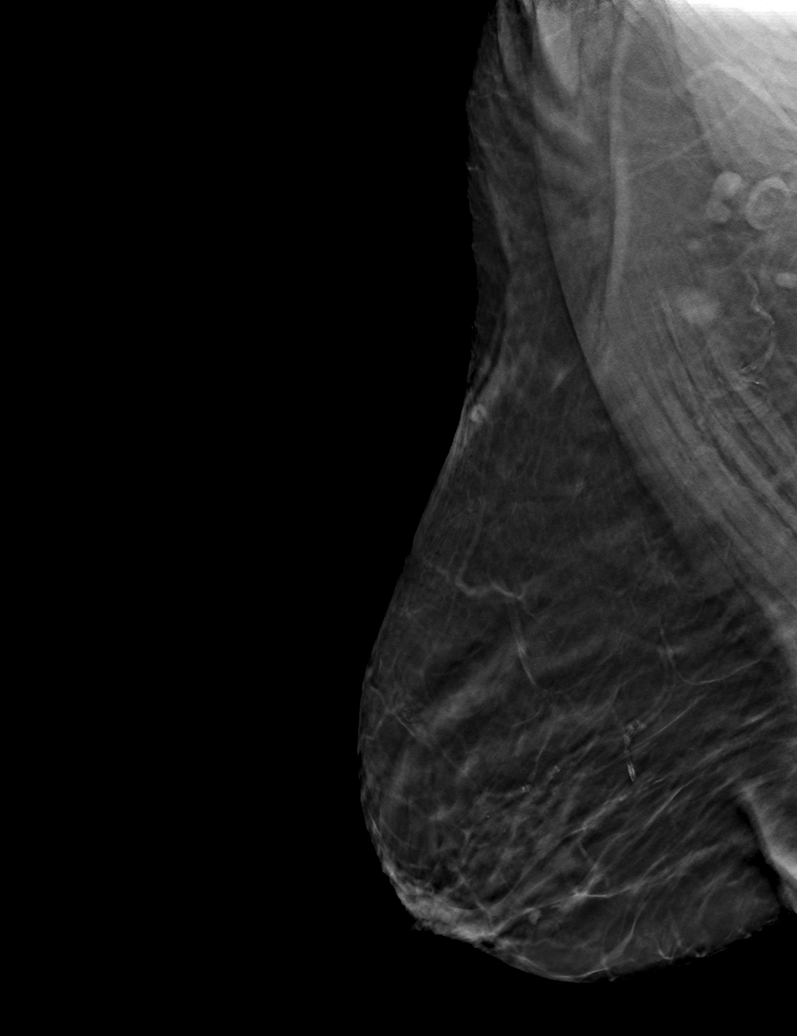

[R MLO tomo (4 of 4) · tomo slice 15/30.0]
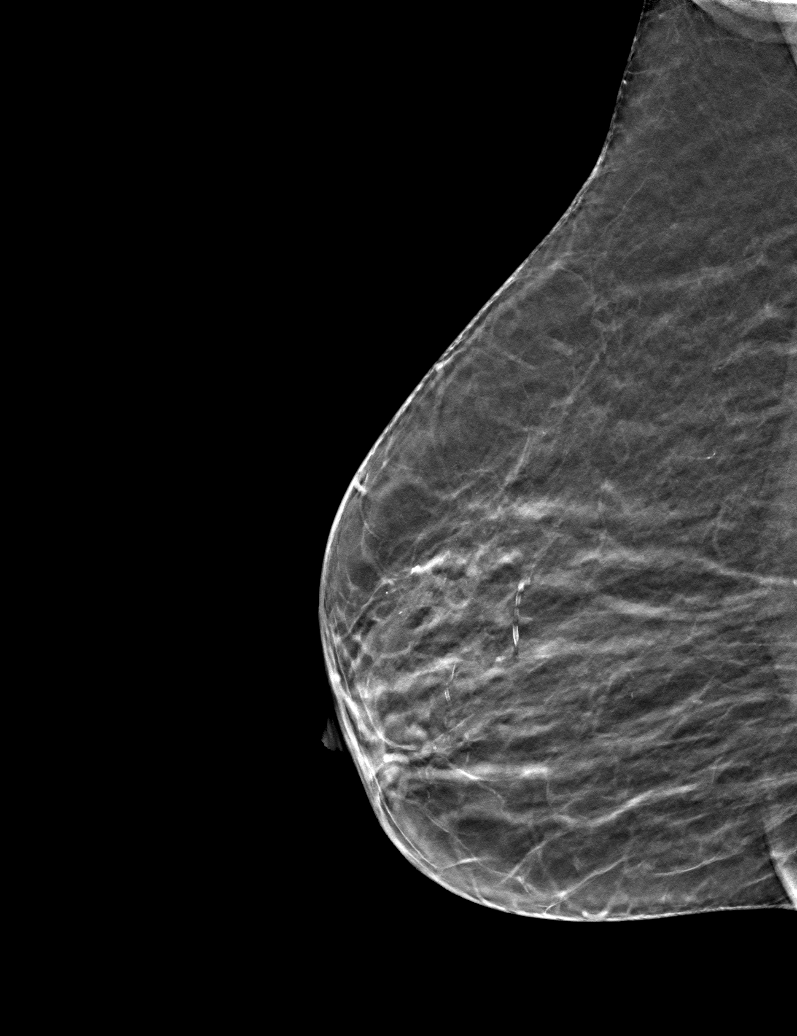

[9 of 21 positions shown; findings below may reference images not displayed]

ACR Breast Density Category b: There are scattered areas of
fibroglandular density.
FINDINGS: Routine 2D and 3D views of the right breast demonstrate no
suspicious mass, distortion or worrisome calcifications.

Mammographic images were processed with CAD.

On physical exam, a large right axillary mass is identified..

Ultrasound is performed, showing 6.1 x 4.7 x 5.4 cm enlarged right
axillary lymph node/ mass with cystic changes, slightly increased in
size since 02/06/2014..
IMPRESSION: 6.1 x 4.7 x 5.4 cm right axillary lymph node, slightly enlarged
since 02/06/2014. Tissue sampling is recommended to exclude
malignancy/ infection.

RECOMMENDATION:
Ultrasound-guided biopsy of enlarged right axillary lymph node,
which will be performed today but dictated in a separate report.

I have discussed the findings and recommendations with the patient.
Results were also provided in writing at the conclusion of the
visit. If applicable, a reminder letter will be sent to the patient
regarding the next appointment.

BI-RADS CATEGORY  4: Suspicious.

## 2016-01-20 IMAGING — US US EXTREM UP *R* LTD
1 series · 4 of 4 positions shown · non-contrast
Comparison: 02/06/2014 mammogram and ultrasound.

CLINICAL DATA: 73-year-old female with chronically enlarged right
axillary lymph node. Patient with history of latent pulmonary TB.

EXAM:
DIGITAL DIAGNOSTIC RIGHT MAMMOGRAM WITH 3D TOMOSYNTHESIS WITH CAD
ULTRASOUND RIGHT AXILLA

[Series 1: us extrem up *right* ltd · 0.09mm/px · 4 of 4 slices shown]
[im 1/4]
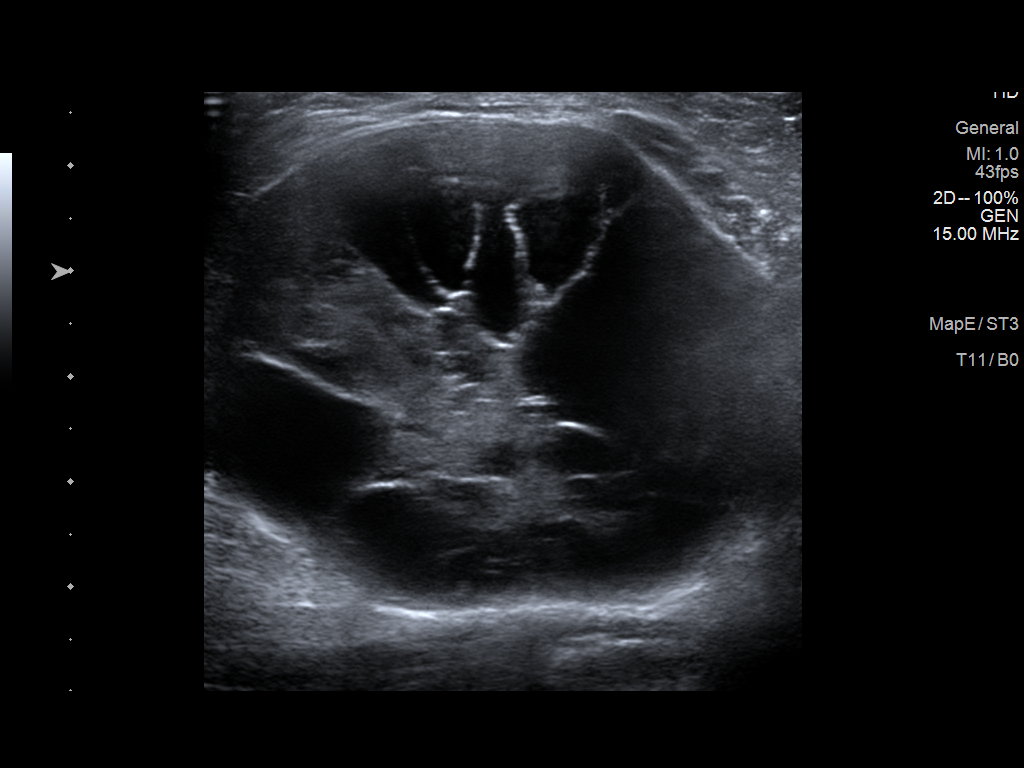
[im 2/4]
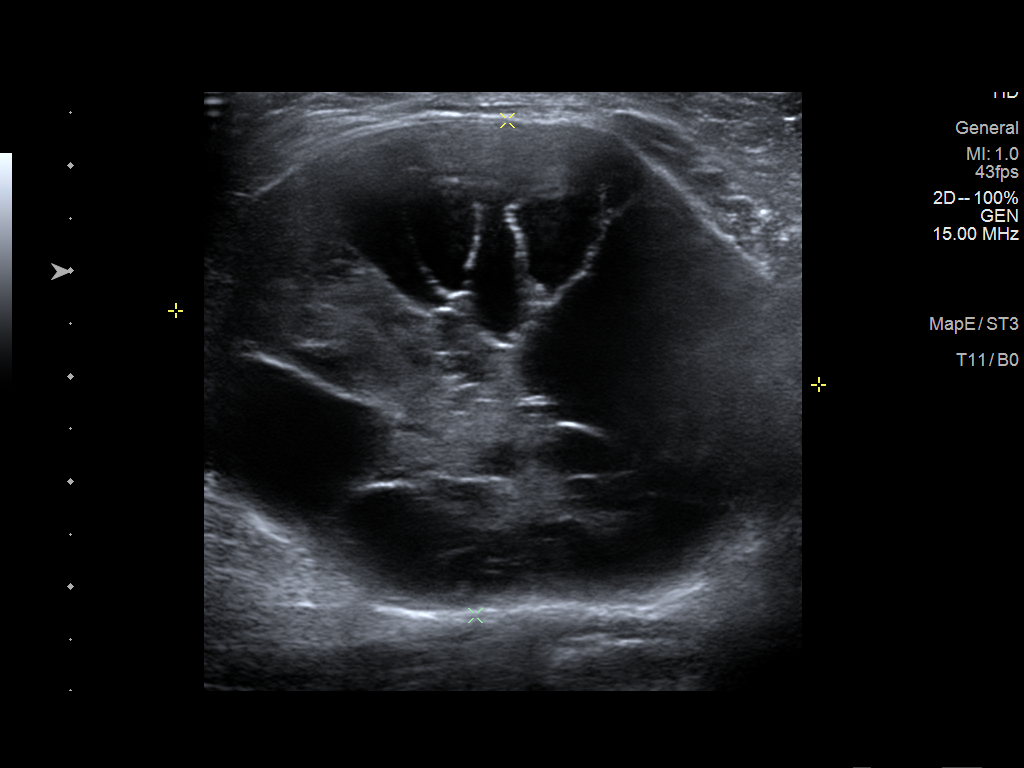
[im 3/4]
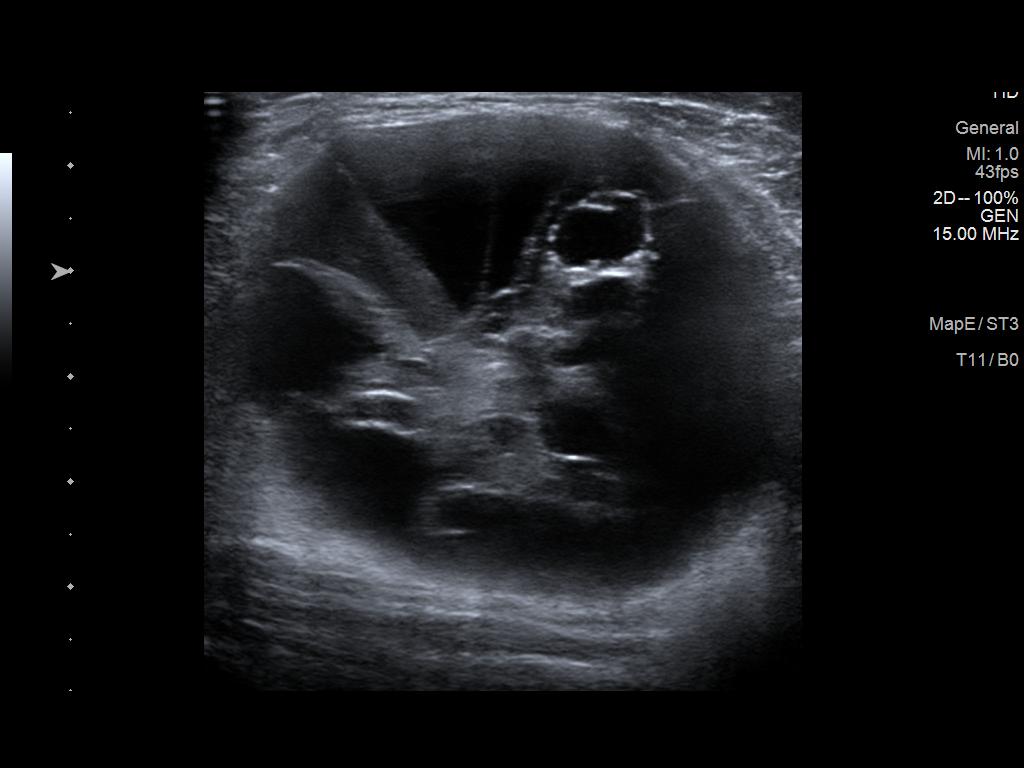
[im 4/4]
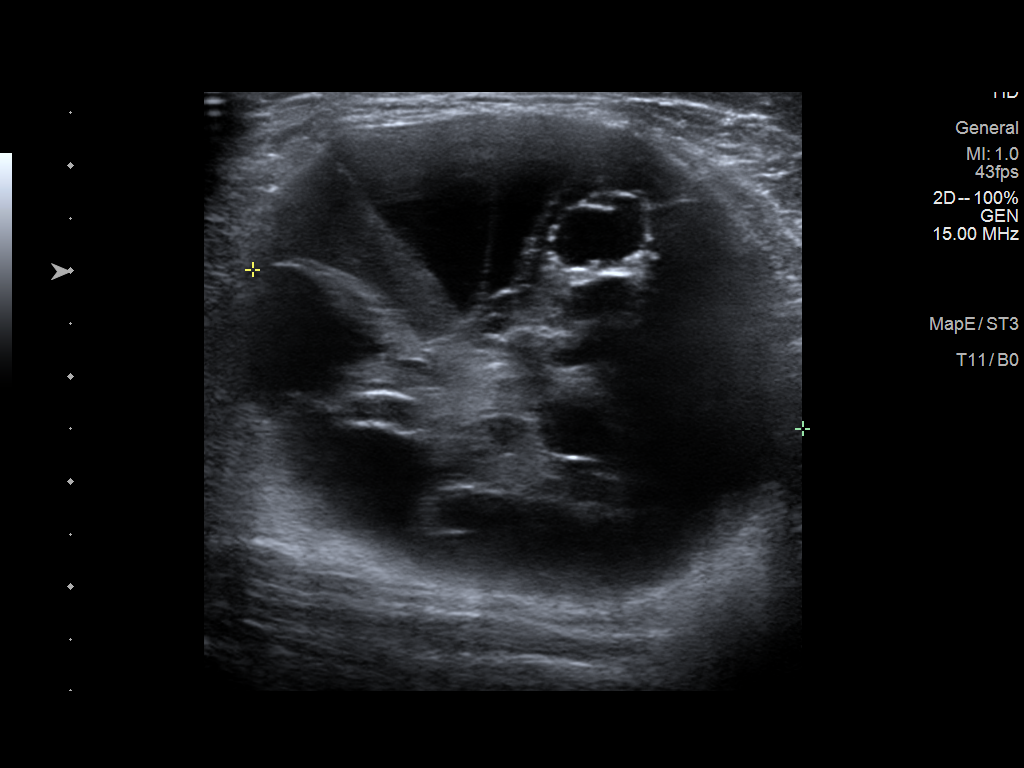

[4 of 4 positions shown; findings below may reference images not displayed]

ACR Breast Density Category b: There are scattered areas of
fibroglandular density.
FINDINGS: Routine 2D and 3D views of the right breast demonstrate no
suspicious mass, distortion or worrisome calcifications.

Mammographic images were processed with CAD.

On physical exam, a large right axillary mass is identified..

Ultrasound is performed, showing 6.1 x 4.7 x 5.4 cm enlarged right
axillary lymph node/ mass with cystic changes, slightly increased in
size since 02/06/2014..
IMPRESSION: 6.1 x 4.7 x 5.4 cm right axillary lymph node, slightly enlarged
since 02/06/2014. Tissue sampling is recommended to exclude
malignancy/ infection.

RECOMMENDATION:
Ultrasound-guided biopsy of enlarged right axillary lymph node,
which will be performed today but dictated in a separate report.

I have discussed the findings and recommendations with the patient.
Results were also provided in writing at the conclusion of the
visit. If applicable, a reminder letter will be sent to the patient
regarding the next appointment.

BI-RADS CATEGORY  4: Suspicious.

## 2016-03-09 ENCOUNTER — Ambulatory Visit: Payer: Self-pay

## 2016-03-25 ENCOUNTER — Other Ambulatory Visit: Payer: Self-pay | Admitting: *Deleted

## 2016-03-25 DIAGNOSIS — I10 Essential (primary) hypertension: Secondary | ICD-10-CM

## 2016-03-25 MED ORDER — HYDROCHLOROTHIAZIDE 12.5 MG PO CAPS: 12.5000 mg | ORAL_CAPSULE | Freq: Every day | ORAL | 3 refills | Status: DC

## 2016-03-25 MED ORDER — METFORMIN HCL 500 MG PO TABS: 500.0000 mg | ORAL_TABLET | Freq: Every day | ORAL | 4 refills | Status: DC

## 2016-03-25 MED ORDER — LISINOPRIL 2.5 MG PO TABS: 2.5000 mg | ORAL_TABLET | Freq: Every day | ORAL | 4 refills | Status: DC

## 2016-04-06 ENCOUNTER — Encounter: Payer: Self-pay | Admitting: *Deleted

## 2016-05-19 ENCOUNTER — Encounter: Payer: Self-pay | Admitting: Internal Medicine

## 2016-06-02 ENCOUNTER — Encounter: Payer: Self-pay | Admitting: Internal Medicine

## 2016-06-02 ENCOUNTER — Ambulatory Visit (INDEPENDENT_AMBULATORY_CARE_PROVIDER_SITE_OTHER): Payer: Self-pay | Admitting: Internal Medicine

## 2016-06-02 VITALS — BP 164/67 | HR 66 | Temp 97.5°F | Wt 112.7 lb

## 2016-06-02 DIAGNOSIS — K5900 Constipation, unspecified: Secondary | ICD-10-CM | POA: Insufficient documentation

## 2016-06-02 DIAGNOSIS — Z23 Encounter for immunization: Secondary | ICD-10-CM

## 2016-06-02 DIAGNOSIS — D649 Anemia, unspecified: Secondary | ICD-10-CM | POA: Insufficient documentation

## 2016-06-02 DIAGNOSIS — E119 Type 2 diabetes mellitus without complications: Secondary | ICD-10-CM

## 2016-06-02 DIAGNOSIS — Z9181 History of falling: Secondary | ICD-10-CM

## 2016-06-02 DIAGNOSIS — W19XXXA Unspecified fall, initial encounter: Secondary | ICD-10-CM

## 2016-06-02 DIAGNOSIS — Z0000 Encounter for general adult medical examination without abnormal findings: Secondary | ICD-10-CM

## 2016-06-02 DIAGNOSIS — Z79899 Other long term (current) drug therapy: Secondary | ICD-10-CM

## 2016-06-02 DIAGNOSIS — Z7984 Long term (current) use of oral hypoglycemic drugs: Secondary | ICD-10-CM

## 2016-06-02 DIAGNOSIS — I10 Essential (primary) hypertension: Secondary | ICD-10-CM

## 2016-06-02 HISTORY — DX: Constipation, unspecified: K59.00

## 2016-06-02 HISTORY — DX: Unspecified fall, initial encounter: W19.XXXA

## 2016-06-02 LAB — GLUCOSE, CAPILLARY: Glucose-Capillary: 211 mg/dL — ABNORMAL HIGH (ref 65–99)

## 2016-06-02 LAB — POCT GLYCOSYLATED HEMOGLOBIN (HGB A1C): Hemoglobin A1C: 7.1

## 2016-06-02 MED ORDER — FREESTYLE LANCETS MISC: 12 refills | Status: DC

## 2016-06-02 MED ORDER — DOCUSATE SODIUM 100 MG PO CAPS: 100.0000 mg | ORAL_CAPSULE | Freq: Every day | ORAL | 1 refills | Status: AC | PRN

## 2016-06-02 NOTE — Assessment & Plan Note (Signed)
Last hgb in 04/2015 was 10.8. Will check another CBC to ensure hgb is not down trending.

## 2016-06-02 NOTE — Assessment & Plan Note (Addendum)
Patient reports she is up-to-date with her Pap smear. She last had one earlier this year that she states was normal. Flu shot given today. Will place referral for mammogram.

## 2016-06-02 NOTE — Patient Instructions (Signed)
I have place a referral for a mammogram for you.   Please make sure to check your blood sugar the next time you feel dizzy and weak.

## 2016-06-02 NOTE — Assessment & Plan Note (Signed)
Lab Results  Component Value Date   HGBA1C 7.1 06/02/2016   HGBA1C 7.0 11/27/2015   HGBA1C 6.8 03/17/2015     Assessment: Diabetes control:  controlled Progress toward A1C goal:   at goal Comments: On metformin 500 mg daily. She is out of lancets. She wears thick socks in her house and does not check her feet every day  Plan: Medications:  continue current medications Instruction/counseling given: discussed foot care Other plans: refilled lancets. Foot exam done today. Follow-up in 3 months for hemoglobin A1c.

## 2016-06-02 NOTE — Assessment & Plan Note (Signed)
Pt complaining of constipation. Her last BM was yesterday and she feels that it was not adequate. She does not drink much liquids.   - rx for colace - encourage increased po fluid intake

## 2016-06-02 NOTE — Assessment & Plan Note (Signed)
BP Readings from Last 3 Encounters:  06/02/16 (!) 164/67  11/27/15 138/64  06/04/15 (!) 130/55    Lab Results  Component Value Date   NA 139 11/27/2015   K 3.8 11/27/2015   CREATININE 0.98 11/27/2015    Assessment: Blood pressure control:  elevated Progress toward BP goal:   deteriorated Comments: On lisinopril 2.5 mg daily and hydrochlorothiazide 12.5 mg daily. Last bmet within normal limits.  Plan: Medications:  continue current medications Other plans: Due to recent fall 2 weeks ago will not make any changes to her home blood pressure regimen in case follows due to hypotension. Follow-up in 3 months for blood pressure check.

## 2016-06-02 NOTE — Assessment & Plan Note (Signed)
Patient had a fall from standing 2 weeks ago without any loss of consciousness or head trauma. Her symptoms prior to falling more consistent with either hypotension, hypoglycemia, or hypovolemia. She has not had any falls since then and denies any current symptoms of dizziness. She was able to ambulate in the exam room and stand from a seated position without these of her arms. She does not have any symptoms of orthostatic hypotension. She has had decreased appetite but her weight has remained stable.   -Will continue to monitor. Will not make any changes to her blood pressure regimen at this time in case follows due to hypotension. Refilled lancets and reminded patient to check her blood glucose the next time she has an episode similar to this

## 2016-06-02 NOTE — Progress Notes (Signed)
   CC: diabetes and HTN  HPI:  [name] is a 75 y.o. with past medical history as outlined below who presents to clinic for HTN and DM follow up.   2 weeks ago patient states that she fell face forward. She was able to get right back up afterwards and she denies hitting her head. Prior to falling she felt dizzy, weak, and felt a black shade come over her vision. Since then she has not had any more episodes like this. Her daughter in the room with her believes is due to not eating well that day.  Please see problem list for status of the pt's chronic medical problems.  Past Medical History:  Diagnosis Date  . 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 Chronic cough    with negative chest CT  . Diabetes mellitus    history of last HA1C 05/2013 6.1, over the years been <6.5   . Hyperlipidemia   . Hypertension   . LSIL (low grade squamous intraepithelial lesion) on Pap smear    04/2013   . Osteoporosis    Dexa scan 4/09, starting alendronate 6/09; DEXA 01/29/2013 with T score ranges -2.5 to -3.4 rec repeat DEXA in 1 yr  . 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    Review of Systems:  Denies fevers, chills, night sweats, nausea, vomiting, blood in her stools, current dizziness, fatigue, decreased hearing, and orthostatic hypotension. Positive for decreased appetite and constipation.  Physical Exam:  Vitals:   06/02/16 0837  BP: (!) 164/67  Pulse: 66  Temp: 97.5 F (36.4 C)  TempSrc: Oral  SpO2: 100%  Weight: 112 lb 11.2 oz (51.1 kg)   Physical Exam  Constitutional: She appears well-nourished. No distress.  HENT:  Head: Normocephalic.  Right Ear: External ear normal. Tympanic membrane is not injected and not erythematous.  Left Ear: External ear normal. Tympanic membrane is not injected and not erythematous.  Mouth/Throat: Oropharynx is clear and moist. No  oropharyngeal exudate.  Cardiovascular: Normal rate, regular rhythm, normal heart sounds and intact distal pulses.  Exam reveals no gallop and no friction rub.   No murmur heard. Pulmonary/Chest: Effort normal and breath sounds normal. No respiratory distress. She has no wheezes. She has no rales.  Abdominal: Soft. Bowel sounds are normal. She exhibits no distension. There is no tenderness. There is no rebound and no guarding.  Musculoskeletal: She exhibits no edema.  Right axilla mass unchanged from previous exams. Able to stand up from a seated position without the use of her arms  Skin: Skin is warm and dry. No rash noted. She is not diaphoretic. No erythema. No pallor.    Assessment & Plan:   See Encounters Tab for problem based charting.  Patient discussed with Dr. Angelia Mould

## 2016-06-03 LAB — CBC
HEMATOCRIT: 37.3 % (ref 34.0–46.6)
Hemoglobin: 12.5 g/dL (ref 11.1–15.9)
MCH: 31.1 pg (ref 26.6–33.0)
MCHC: 33.5 g/dL (ref 31.5–35.7)
MCV: 93 fL (ref 79–97)
NRBC: 1 % — AB (ref 0–0)
Platelets: 162 10*3/uL (ref 150–379)
RBC: 4.02 x10E6/uL (ref 3.77–5.28)
RDW: 13.6 % (ref 12.3–15.4)
WBC: 3.8 10*3/uL (ref 3.4–10.8)

## 2016-06-08 NOTE — Progress Notes (Signed)
Internal Medicine Clinic Attending  Case discussed with Dr. Truong soon after the resident saw the patient.  We reviewed the resident's history and exam and pertinent patient test results.  I agree with the assessment, diagnosis, and plan of care documented in the resident's note. 

## 2016-06-09 ENCOUNTER — Other Ambulatory Visit: Payer: Self-pay | Admitting: Internal Medicine

## 2016-06-09 DIAGNOSIS — Z1231 Encounter for screening mammogram for malignant neoplasm of breast: Secondary | ICD-10-CM

## 2016-08-31 ENCOUNTER — Encounter: Payer: Self-pay | Admitting: Pulmonary Disease

## 2016-08-31 ENCOUNTER — Ambulatory Visit (INDEPENDENT_AMBULATORY_CARE_PROVIDER_SITE_OTHER): Payer: Self-pay | Admitting: Pulmonary Disease

## 2016-08-31 VITALS — BP 117/53 | HR 71 | Temp 98.2°F | Ht 60.0 in | Wt 114.4 lb

## 2016-08-31 DIAGNOSIS — D3610 Benign neoplasm of peripheral nerves and autonomic nervous system, unspecified: Secondary | ICD-10-CM

## 2016-08-31 DIAGNOSIS — I10 Essential (primary) hypertension: Secondary | ICD-10-CM

## 2016-08-31 DIAGNOSIS — Z79899 Other long term (current) drug therapy: Secondary | ICD-10-CM

## 2016-08-31 NOTE — Assessment & Plan Note (Signed)
Assessment: Known mass in right axilla. 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   Plan: Given the discomfort the mass is causing, will refer her to general surgery

## 2016-08-31 NOTE — Progress Notes (Signed)
   CC: right axilla lump  HPI:  Connie Powell is a 76 y.o. woman with history as noted below here for evaluation of right axilla lump.  Her daughter provided history as they declined interpreter. The lump has been there for 2 years and it has grown in size since their last clinic visit. Causing more discomfort.    Past Medical History:  Diagnosis Date  . 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 Chronic cough    with negative chest CT  . Diabetes mellitus    history of last HA1C 05/2013 6.1, over the years been <6.5   . Hyperlipidemia   . Hypertension   . LSIL (low grade squamous intraepithelial lesion) on Pap smear    04/2013   . Osteoporosis    Dexa scan 4/09, starting alendronate 6/09; DEXA 01/29/2013 with T score ranges -2.5 to -3.4 rec repeat DEXA in 1 yr  . 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    Review of Systems:   No weight loss No chest pain No dyspnea  Physical Exam:  Vitals:   08/31/16 1425 08/31/16 1454  BP: (!) 156/51 (!) 117/53  Pulse: 80 71  Temp: 98.2 F (36.8 C)   TempSrc: Oral   SpO2: 100%   Weight: 114 lb 6.4 oz (51.9 kg)   Height: 5' (1.524 m)    General Apperance: NAD HEENT: Normocephalic, atraumatic, anicteric sclera Neck: Supple, trachea midline Lungs: Clear to auscultation bilaterally. No wheezes, rhonchi or rales. Breathing comfortably Heart: Regular rate and rhythm, no murmur/rub/gallop Abdomen: Soft, nontender, nondistended, no rebound/guarding Extremities: Warm and well perfused, no edema, right axilla with firm, immobile mass measuring 5in in diameter Skin: No rashes or lesions Neurologic: Alert and interactive. No gross deficits.  Assessment & Plan:   See Encounters Tab for problem based charting.  Patient discussed with Dr. Lynnae January

## 2016-08-31 NOTE — Progress Notes (Signed)
Internal Medicine Clinic Attending  Case discussed with Dr. Krall soon after the resident saw the patient.  We reviewed the resident's history and exam and pertinent patient test results.  I agree with the assessment, diagnosis, and plan of care documented in the resident's note. 

## 2016-08-31 NOTE — Assessment & Plan Note (Signed)
BP initially 156/51 but on recheck was 117/53. Continue HCTZ 12.5mg  daily and lisinopril 2.5mg  daily.

## 2016-08-31 NOTE — Patient Instructions (Addendum)
  Benign Peripheral Nerve Tumor A benign peripheral nerve tumor is an abnormal mass of cells. The mass forms in or near the peripheral nerves, which connect your brain and spinal cord to the rest of your body. This type of tumor is not cancerous.  Schwannoma (common). This type can appear in any part of the body.  What are the causes? In most cases, the cause of this condition is not known. In some cases, the condition is caused by a genetic disease.  What increases the risk? This condition is more likely to develop in people who have a family history of nerve tumors, like neurofibromatosis type 1 and 2 and schwannomatosis.  What are the signs or symptoms? Common symptoms of this condition include:  Pain.  Numbness.  Tingling.  Weakness.  Dizziness.  Losing your balance.  Lump or bump under the skin.  Hearing Loss.  How is this treated? Treatment for this condition includes:  Monitoring the tumor.  Surgery to remove the tumor.  Medicine to help with pain.  Follow these instructions at home:  Take over-the-counter and prescription medicines only as told by your health care provider.  Do not drive or use heavy machinery while taking prescription pain medicine.  To prevent or treat constipation while you are taking prescription pain medicine, your health care provider may recommend that you:  Drink enough fluid to keep your urine clear or pale yellow.  Take over-the-counter or prescription medicines.  Eat foods that are high in fiber, such as fresh fruits and vegetables, whole grains, and beans.  Limit foods that are high in fat and processed sugars, such as fried and sweet foods.  Keep all follow-up visits as told by your health care provider. This is important.  Contact a health care provider if:  Your pain medicine is not working.  Your symptoms get worse.  You have a fever.  A lump or bump under your skin changes in size or color  Summary  A  benign peripheral nerve tumor is an abnormal mass of cells. The mass forms in or near the peripheral nerves, which connect your brain and spinal cord to the rest of your body.  There are several different types of benign peripheral nerve tumors.  In most cases, the cause of this condition is not known. In some cases, the condition is caused by a genetic disease.  Treatment may including monitoring the tumor, surgery to remove the tumor, and medicines to help with pain. This information is not intended to replace advice given to you by your health care provider. Make sure you discuss any questions you have with your health care provider. Document Released: 06/26/2013 Document Revised: 06/09/2016 Document Reviewed: 06/09/2016 Elsevier Interactive Patient Education  2017 [employer].

## 2016-09-13 ENCOUNTER — Ambulatory Visit: Payer: Self-pay

## 2016-09-20 ENCOUNTER — Ambulatory Visit: Payer: Self-pay

## 2016-12-14 ENCOUNTER — Encounter: Payer: Self-pay | Admitting: *Deleted

## 2017-02-14 ENCOUNTER — Encounter: Payer: Self-pay | Admitting: Internal Medicine

## 2017-02-14 ENCOUNTER — Ambulatory Visit (INDEPENDENT_AMBULATORY_CARE_PROVIDER_SITE_OTHER): Payer: Self-pay | Admitting: Internal Medicine

## 2017-02-14 VITALS — BP 140/56 | HR 65 | Temp 98.2°F | Ht 60.0 in | Wt 112.1 lb

## 2017-02-14 DIAGNOSIS — D3610 Benign neoplasm of peripheral nerves and autonomic nervous system, unspecified: Secondary | ICD-10-CM

## 2017-02-14 NOTE — Patient Instructions (Signed)
Ms. Dinsmore it was nice meeting you today.  I have referred you to general surgery at [facility] from the scheduling department will be calling you soon. I would advise you to make an appointment as soon as possible. In addition, please remember to speak to the financial counselor at that office.

## 2017-02-15 NOTE — Progress Notes (Signed)
   CC: Patient is here to discuss her chronic right axillary mass.  HPI:  [name] is a 76 y.o. female with a past medical history of conditions listed below presenting to the clinic to discuss her chronic right axillary mass. She has a known history of schwannoma. Please see problem based charting for the status of the patient's current and chronic medical conditions.   Past Medical History:  Diagnosis Date  . 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 Chronic cough    with negative chest CT  . Diabetes mellitus    history of last HA1C 05/2013 6.1, over the years been <6.5   . Hyperlipidemia   . Hypertension   . LSIL (low grade squamous intraepithelial lesion) on Pap smear    04/2013   . Osteoporosis    Dexa scan 4/09, starting alendronate 6/09; DEXA 01/29/2013 with T score ranges -2.5 to -3.4 rec repeat DEXA in 1 yr  . 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   Review of Systems: Pertinent positives mentioned in HPI. Remainder of all ROS negative.   Physical Exam:  Vitals:   02/14/17 1532  BP: (!) 140/56  Pulse: 65  Temp: 98.2 F (36.8 C)  TempSrc: Oral  SpO2: 100%  Weight: 112 lb 1.6 oz (50.8 kg)  Height: 5' (1.524 m)   Physical Exam  Constitutional: She is oriented to person, place, and time. She appears well-developed and well-nourished. No distress.  HENT:  Head: Normocephalic and atraumatic.  Eyes: Right eye exhibits no discharge. Left eye exhibits no discharge.  Cardiovascular: Normal rate, regular rhythm and intact distal pulses.   Pulmonary/Chest: Effort normal and breath sounds normal. No respiratory distress. She has no wheezes. She has no rales.  Abdominal: Soft. Bowel sounds are normal. She exhibits no distension. There is no tenderness.  Musculoskeletal: She exhibits no edema.  A 3.5 x 3.5 inch hard, mobile mass palpated below the right axilla.    Neurological: She is alert and oriented to person, place, and time.  Skin: Skin is warm and dry.    Assessment & Plan:   See Encounters Tab for problem based charting.  Patient discussed with Dr. Lynnae January

## 2017-02-15 NOTE — Assessment & Plan Note (Addendum)
History of present illness Patient reports having a chronic mass in her right axilla. Reports having pain/discomfort only when she lifts her arm but not otherwise. The mass is getting bigger in size. Denies having any pain or swelling in her right arm. Denies having any fevers, chills, fatigue or unintentional weight loss. I stressed the importance of going to her surgery appointment as soon as possible as the mass has been getting bigger in size. She was accompanied by her granddaughter who mentioned the patient has an orange card and is worried about the financial aspects of going for surgery.  Assessment Patient has a known history of a right axillary mass which was found to be a schwannoma based on biopsy done in April 2016. She has been referred to surgery in the past but has had 3 no-shows with [facility] surgery in May 2016.  Plan -Place another referral to surgery. Per staff, since patient has had several no-shows with [facility] surgery, she will be referred to [facility] now. -Advised patient and her granddaughter to make an appointment as soon as possible to see a Psychologist, sport and exercise. -Advised them to speak to the financial counselor at [facility] to help with the cost of her appointments/ surgery.

## 2017-02-15 NOTE — Progress Notes (Signed)
Internal Medicine Clinic Attending  Case discussed with Dr. Rathoreat the time of the visit. We reviewed the resident's history and exam and pertinent patient test results. I agree with the assessment, diagnosis, and plan of care documented in the resident's note.  

## 2017-03-04 ENCOUNTER — Telehealth: Payer: Self-pay | Admitting: *Deleted

## 2017-03-04 NOTE — Telephone Encounter (Signed)
CALLED PATIENT LEFT VOICE MESSAGE FOR PATIENT REGARDING GENERAL SURGERY REFERRAL WITH [facility]. THERE ARE NO SLOTS AVAILABLE FOR THE REST OF THE YEAR 2018. OUR OFFICE CAN REFER YOU ELSEWHERE BUT YOU HAVE TO PAY OUT OF POCKET. PLEASE RETURN CALL OF YOU CAN FOR CAN NOT PAY FOR THE SERVICE IS REFERRED ELSEWHERE.  RETURN CALL TO CLINIC [phone].

## 2017-03-30 ENCOUNTER — Ambulatory Visit: Payer: Self-pay

## 2017-06-08 ENCOUNTER — Other Ambulatory Visit: Payer: Self-pay

## 2017-06-08 ENCOUNTER — Ambulatory Visit (INDEPENDENT_AMBULATORY_CARE_PROVIDER_SITE_OTHER): Payer: Self-pay | Admitting: Internal Medicine

## 2017-06-08 ENCOUNTER — Encounter: Payer: Self-pay | Admitting: Internal Medicine

## 2017-06-08 VITALS — BP 174/84 | HR 80 | Temp 97.6°F | Ht 60.0 in | Wt 116.3 lb

## 2017-06-08 DIAGNOSIS — Z7984 Long term (current) use of oral hypoglycemic drugs: Secondary | ICD-10-CM

## 2017-06-08 DIAGNOSIS — Z86018 Personal history of other benign neoplasm: Secondary | ICD-10-CM

## 2017-06-08 DIAGNOSIS — Z79899 Other long term (current) drug therapy: Secondary | ICD-10-CM

## 2017-06-08 DIAGNOSIS — E785 Hyperlipidemia, unspecified: Secondary | ICD-10-CM

## 2017-06-08 DIAGNOSIS — E119 Type 2 diabetes mellitus without complications: Secondary | ICD-10-CM

## 2017-06-08 DIAGNOSIS — I10 Essential (primary) hypertension: Secondary | ICD-10-CM

## 2017-06-08 DIAGNOSIS — Z0000 Encounter for general adult medical examination without abnormal findings: Secondary | ICD-10-CM

## 2017-06-08 DIAGNOSIS — D3610 Benign neoplasm of peripheral nerves and autonomic nervous system, unspecified: Secondary | ICD-10-CM

## 2017-06-08 DIAGNOSIS — Z978 Presence of other specified devices: Secondary | ICD-10-CM

## 2017-06-08 DIAGNOSIS — R87612 Low grade squamous intraepithelial lesion on cytologic smear of cervix (LGSIL): Secondary | ICD-10-CM

## 2017-06-08 DIAGNOSIS — D649 Anemia, unspecified: Secondary | ICD-10-CM

## 2017-06-08 LAB — GLUCOSE, CAPILLARY: Glucose-Capillary: 155 mg/dL — ABNORMAL HIGH (ref 65–99)

## 2017-06-08 LAB — POCT GLYCOSYLATED HEMOGLOBIN (HGB A1C): HEMOGLOBIN A1C: 7

## 2017-06-08 MED ORDER — LISINOPRIL 10 MG PO TABS: 10.0000 mg | ORAL_TABLET | Freq: Every day | ORAL | 1 refills | Status: DC

## 2017-06-08 NOTE — Assessment & Plan Note (Signed)
CBC performed at [facility] on 05/25/2017 with hemoglobin of 12.5.

## 2017-06-08 NOTE — Assessment & Plan Note (Signed)
Surgically removed at [facility] on 06/02/2017. Patient tolerated the procedure well. JP drain still in placed at visit today with serosanguineous fluid in bulb . No evidence of edema, erythema, or signs of infection around drain site.   Will follow up in care everywhere for any further recommendations per gen surg.

## 2017-06-08 NOTE — Progress Notes (Signed)
   CC: hypertension, type 2 diabetes mellitus   HPI:  Connie Powell is a 76 y.o. with past medical history as documented below presenting for follow up of type 2 diabetes and hypertension. She is accompanied by her [name] who is translating for her. Patient had surgery on 11/29 at [facility] for removal of right axilla schwannoma. The patient tolerated the surgery well. She has no acute complaints at this visit. Please see encounter based charting for a more in depth description of the patient's chronic medical problems.      Past Medical History:  Diagnosis Date  . 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 Chronic cough    with negative chest CT  . Diabetes mellitus    history of last HA1C 05/2013 6.1, over the years been <6.5   . Hyperlipidemia   . Hypertension   . LSIL (low grade squamous intraepithelial lesion) on Pap smear    04/2013   . Osteoporosis    Dexa scan 4/09, starting alendronate 6/09; DEXA 01/29/2013 with T score ranges -2.5 to -3.4 rec repeat DEXA in 1 yr  . 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  Eyes: Negative for blurred vision and double vision.  Respiratory: Negative for shortness of breath.   Cardiovascular: Negative for chest pain.  Gastrointestinal: Negative for abdominal pain, constipation, diarrhea, heartburn, nausea and vomiting.  Genitourinary: Negative for dysuria and hematuria.    Physical Exam:  Vitals:   06/08/17 1353  BP: (!) 174/84  Pulse: 80  Temp: 97.6 F (36.4 C)  TempSrc: Oral  SpO2: 100%  Weight: 116 lb 4.8 oz (52.8 kg)  Height: 5' (1.524 m)   General: Sitting in exam chair comfortably, NAD HEENT: [place]/AT, EOMI, no scleral icterus, PERRL, oropharynx clear and moist, no cervical adenopathy    Cardiac: RRR, No R/M/G appreciated, distal pulse intact  Pulm: normal effort, CTAB Abd: soft, non tender, non distended, BS normal Ext: extremities well perfused, no peripheral edema  Right axilla: post surgical drain in place, no swelling, erythema or tenderness to palpation around incision and drain site  Neuro: alert and oriented X3, cranial nerves II-XII grossly intact Skin: Warm and dry   Assessment & Plan:   See Encounters Tab for problem based charting.  Patient seen with Dr. Eppie Gibson

## 2017-06-08 NOTE — Assessment & Plan Note (Signed)
Patient and grandson deferred flu shot to next visit as patient just had surgery. Advised that there is no contraindication to get the flu shot today but if they feel more comfortable getting in the 1 week at follow up appointment that's fine.   Plan: -flu vaccination at f/u appointment

## 2017-06-08 NOTE — Assessment & Plan Note (Addendum)
BP Readings from Last 3 Encounters:  06/08/17 (!) 174/84  02/14/17 (!) 140/56  08/31/16 (!) 117/53  Patient's blood pressure not well controlled this visit. She ran out of her blood pressure medications one week ago. Current regimen includes HCTZ 12.5 mg and Lisinopril 2.5 mg. BMET performed at [facility] on 05/25/2017 was within normal limits. Cr 0.87, K+ 4.0. No indication for repeat labs.   Plan: -Stop HCTZ 12.5 mg -Increase Lisinopril to 10 mg daily  -RTC in one week for blood pressure recheck and adjustment of regimen if needed

## 2017-06-08 NOTE — Assessment & Plan Note (Signed)
Last lipid panel in 2015. Patient not on statin therapy.  Lipid Panel     Component Value Date/Time   CHOL 200 01/16/2014 1543   TRIG 90 01/16/2014 1543   HDL 47 01/16/2014 1543   CHOLHDL 4.3 01/16/2014 1543   VLDL 18 01/16/2014 1543   LDLCALC 135 (H) 01/16/2014 1543    Plan: -Check lipid panel today

## 2017-06-08 NOTE — Assessment & Plan Note (Signed)
Hemoglobin A1C 7.0. Patient on metformin 500 mg daily. She denies numbness or tingling in her hands or feet. Denies changes in vision. Last eye exam in 11/2015 negative for diabetic retinopathy. Patient's grandson requesting meter and test strips.   Assessment:  A1C at goal on current regimen. No diabetic complications.   Plan: -Continue metformin 500 mg daily -Explained to patient's grandson that the patient does not need to check BG with meter as she is well controlled at goal and not on insulin. Provided them with information where the patient can purchase a glucometer and strips for a reasonable price if still interested.

## 2017-06-08 NOTE — Patient Instructions (Addendum)
Connie Powell,   It was a pleasure meeting you today.   I have made changes to your blood pressure medication. STOP taking the  Hydrochlorothiazide 12.5 mg daily. I have increased your lisinopril from 2.5 mg to 10 mg once daily. Please return to clinic in one week for a blood pressure recheck.   Your Hemoglobin A1c was 7.0! Your diabetes continues to be well controlled.   I am also sending you too the lab to check cholesterol levels. I will call you with the results.   I have also referred you to gynecology because you had an abnormal cervical pap smear in 2016, which doesn't seem to have been followed up. The office will be in touch with you regarding scheduling that appointment.   FOLLOW-UP INSTRUCTIONS When: 1 WEEK For: Blood pressure check  What to bring: All blood pressure medications

## 2017-06-08 NOTE — Assessment & Plan Note (Signed)
Last record of PAP smear in Epic in 2016. Cytology was positive for LGSIL.  Patient states she has followed up with gynecology, but there is no recent note in the system. [name] was not sure of the patients GYN provider name. Per chart review patient did have colposcopy in 12/2014 and was supposed to follow up with Ob/Gyn but there were no follow up notes. Results of colpo (in surgical pathology) were negative for malignancy. Unclear if has had PAP smear since colpo, none recorded.   Plan: -Referred to Gyn -Asked grandson to bring the Gyn provider's name to follow up visit to clarify need for future Pap smears, etc

## 2017-06-09 LAB — LIPID PANEL
CHOL/HDL RATIO: 4.3 ratio (ref 0.0–4.4)
Cholesterol, Total: 176 mg/dL (ref 100–199)
HDL: 41 mg/dL (ref >39)
LDL Calculated: 118 mg/dL — ABNORMAL HIGH (ref 0–99)
Triglycerides: 84 mg/dL (ref 0–149)
VLDL CHOLESTEROL CAL: 17 mg/dL (ref 5–40)

## 2017-06-13 NOTE — Progress Notes (Signed)
I saw and evaluated the patient.  I personally confirmed the key portions of Dr. Revonda Standard history and exam and reviewed pertinent patient test results.  The assessment, diagnosis, and plan were formulated together and I agree with the documentation in the resident's note.

## 2017-07-27 ENCOUNTER — Other Ambulatory Visit: Payer: Self-pay | Admitting: *Deleted

## 2017-07-27 MED ORDER — METFORMIN HCL 500 MG PO TABS: 500.0000 mg | ORAL_TABLET | Freq: Every day | ORAL | 0 refills | Status: DC

## 2017-07-27 NOTE — Telephone Encounter (Signed)
Spoke to caregiver [name] [phone], cleared all pharmacies except [facility], sent refill request, encoraged to keep appt

## 2017-08-10 ENCOUNTER — Ambulatory Visit: Payer: Self-pay | Admitting: Obstetrics and Gynecology

## 2017-08-19 ENCOUNTER — Ambulatory Visit: Payer: Self-pay

## 2017-08-31 ENCOUNTER — Encounter: Payer: Self-pay | Admitting: Internal Medicine

## 2017-09-02 ENCOUNTER — Other Ambulatory Visit: Payer: Self-pay | Admitting: Internal Medicine

## 2017-09-02 DIAGNOSIS — I10 Essential (primary) hypertension: Secondary | ICD-10-CM

## 2017-09-06 NOTE — Telephone Encounter (Signed)
Received refill request from [facility] for lisinopril 2.5mg  take tab daily.  Pt last picked up rx for lisinopril 10mg  one daily on 06/08/17 for 3mth supply (confirmed with pharmacy).  Will have pcp send in new rx to [place].Despina Hidden Cassady3/5/20194:27 PM

## 2017-09-22 MED ORDER — LISINOPRIL 10 MG PO TABS: 10.0000 mg | ORAL_TABLET | Freq: Every day | ORAL | 1 refills | Status: DC

## 2017-09-28 ENCOUNTER — Ambulatory Visit (INDEPENDENT_AMBULATORY_CARE_PROVIDER_SITE_OTHER): Payer: Self-pay | Admitting: Internal Medicine

## 2017-09-28 ENCOUNTER — Encounter: Payer: Self-pay | Admitting: Internal Medicine

## 2017-09-28 ENCOUNTER — Other Ambulatory Visit: Payer: Self-pay

## 2017-09-28 VITALS — BP 150/60 | HR 66 | Temp 97.4°F | Ht 60.0 in | Wt 113.1 lb

## 2017-09-28 DIAGNOSIS — Z79899 Other long term (current) drug therapy: Secondary | ICD-10-CM

## 2017-09-28 DIAGNOSIS — Z86018 Personal history of other benign neoplasm: Secondary | ICD-10-CM

## 2017-09-28 DIAGNOSIS — E785 Hyperlipidemia, unspecified: Secondary | ICD-10-CM

## 2017-09-28 DIAGNOSIS — I10 Essential (primary) hypertension: Secondary | ICD-10-CM

## 2017-09-28 DIAGNOSIS — Z7984 Long term (current) use of oral hypoglycemic drugs: Secondary | ICD-10-CM

## 2017-09-28 DIAGNOSIS — K047 Periapical abscess without sinus: Secondary | ICD-10-CM

## 2017-09-28 DIAGNOSIS — R87612 Low grade squamous intraepithelial lesion on cytologic smear of cervix (LGSIL): Secondary | ICD-10-CM

## 2017-09-28 DIAGNOSIS — D3610 Benign neoplasm of peripheral nerves and autonomic nervous system, unspecified: Secondary | ICD-10-CM

## 2017-09-28 DIAGNOSIS — E119 Type 2 diabetes mellitus without complications: Secondary | ICD-10-CM

## 2017-09-28 LAB — GLUCOSE, CAPILLARY: GLUCOSE-CAPILLARY: 208 mg/dL — AB (ref 65–99)

## 2017-09-28 LAB — POCT GLYCOSYLATED HEMOGLOBIN (HGB A1C): Hemoglobin A1C: 7.1

## 2017-09-28 NOTE — Patient Instructions (Addendum)
Connie Powell,   Your diabetes is well controlled. Continue taking metformin daily.   I have made no changes to your blood pressure medications at this time.   For the pain associated with your teeth infection, you can take over the counter Aleve or Tylenol. You can take 500 mg of aleve twice daily for pain. Take the aleve with meals.  This should help relieve your tooth pain and occasional head ache.   Return to clinic in 3 months or sooner if needed.

## 2017-09-29 ENCOUNTER — Encounter: Payer: Self-pay | Admitting: Internal Medicine

## 2017-09-29 DIAGNOSIS — K047 Periapical abscess without sinus: Secondary | ICD-10-CM | POA: Insufficient documentation

## 2017-09-29 HISTORY — DX: Periapical abscess without sinus: K04.7

## 2017-09-29 NOTE — Assessment & Plan Note (Signed)
Patient healed well. No pain or discomfort.

## 2017-09-29 NOTE — Assessment & Plan Note (Signed)
BP Readings from Last 3 Encounters:  09/28/17 (!) 150/60  06/08/17 (!) 174/84  02/14/17 (!) 140/56   Patient currently taking Lisinopril 10 mg daily. BP elevated, but given patient's age would not want to decrease her BP to unsafe range. Okay with BP goal of <150/80.   Plan: -Continue Lisinopril 10 mg daily

## 2017-09-29 NOTE — Assessment & Plan Note (Signed)
A1C 7.1. Her diabetes continues to be well controlled on 500 mg of metformin daily.  Plan: Continue 500 mg of metformin daily.

## 2017-09-29 NOTE — Progress Notes (Signed)
   CC: type 2 DM, hypertension  HPI:  Ms.Boneta Skeens is a 77 y.o. female with PMH as documented below presenting for follow up of her type 2 diabetes mellitus and hypertension.  Please see encounter based charting for a detailed description of the patient's chronic and acute medical problems.   Past Medical History:  Diagnosis Date  . 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 Chronic cough    with negative chest CT  . Diabetes mellitus    history of last HA1C 05/2013 6.1, over the years been <6.5   . Hyperlipidemia   . Hypertension   . LSIL (low grade squamous intraepithelial lesion) on Pap smear    04/2013   . Osteoporosis    Dexa scan 4/09, starting alendronate 6/09; DEXA 01/29/2013 with T score ranges -2.5 to -3.4 rec repeat DEXA in 1 yr  . 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  Respiratory: Negative for shortness of breath.   Cardiovascular: Negative for chest pain.  Gastrointestinal: Negative for abdominal pain, nausea and vomiting.  Genitourinary: Negative for dysuria.  Musculoskeletal: Negative.   Neurological: Negative.     Physical Exam:  Vitals:   09/28/17 1603 09/28/17 1645  BP: (!) 154/60 (!) 150/60  Pulse: 68 66  Temp: (!) 97.4 F (36.3 C)   TempSrc: Oral   SpO2: 100%   Weight: 113 lb 1.6 oz (51.3 kg)   Height: 5' (1.524 m)    .Physical Exam  Constitutional: She is oriented to person, place, and time. She appears well-developed and well-nourished. No distress.  HENT:  Head: Normocephalic and atraumatic.  Mouth/Throat: Oropharynx is clear and moist.  Eyes: Conjunctivae are normal. No scleral icterus.  Neck: Normal range of motion. Neck supple.  Cardiovascular: Normal rate, regular rhythm, normal heart sounds and intact distal pulses.  Pulmonary/Chest:  Effort normal and breath sounds normal. She has no wheezes.  Abdominal: Soft. Bowel sounds are normal. There is no tenderness.  Musculoskeletal: Normal range of motion. She exhibits no edema.  Neurological: She is alert and oriented to person, place, and time.  Skin: Skin is warm and dry.    Assessment & Plan:   See Encounters Tab for problem based charting.  Patient discussed with Dr. Beryle Beams

## 2017-09-29 NOTE — Assessment & Plan Note (Signed)
Patient states she follows with gynecology. She cannot remember the name or office that she follows with, but states she has had a negative pap smear this past year.   -Patient may benefit from pap smear at next visit as there are no records in our system of negative pap

## 2017-09-29 NOTE — Assessment & Plan Note (Addendum)
Lipid Panel     Component Value Date/Time   CHOL 176 06/08/2017 1500   TRIG 84 06/08/2017 1500   HDL 41 06/08/2017 1500   CHOLHDL 4.3 06/08/2017 1500   LDLCALC 118 (H) 06/08/2017 1500   LDL elevated, HDL WNL. Patient previously on simvastatin but self discontinued. She is currently no interested in adding back a statin.

## 2017-09-29 NOTE — Assessment & Plan Note (Signed)
Patient is currently taking metronidazole and amoxicillin due to multiple tooth infections. On exam I did not appreciate significant swelling or abscess formation. The patient does endorse mouth pain and occasional headache. Dental surgery is tentatively planned after patient finishes abx course. She denies fevers or chills.   Plan: -Recommended aleve or tylenol OTC for pain control -continue abx course as prescribed by dentist

## 2017-09-30 NOTE — Progress Notes (Signed)
Medicine attending: Medical history, presenting problems, physical findings, and medications, reviewed with resident physician Dr Samantha LaCroce on the day of the patient visit and I concur with her evaluation and management plan. 

## 2017-11-03 ENCOUNTER — Other Ambulatory Visit: Payer: Self-pay | Admitting: Internal Medicine

## 2017-11-08 ENCOUNTER — Other Ambulatory Visit: Payer: Self-pay | Admitting: Internal Medicine

## 2017-11-08 NOTE — Telephone Encounter (Signed)
Rang multiple times  And cut off

## 2017-11-08 NOTE — Telephone Encounter (Signed)
Patient granddaugther lisinopril dosage was change, can you please call her back

## 2017-11-10 NOTE — Telephone Encounter (Signed)
Please call back, granddaughter is calling back, updated phone number

## 2017-11-11 NOTE — Telephone Encounter (Signed)
Verified lisinopril dose w/ grdaughter

## 2017-11-30 ENCOUNTER — Other Ambulatory Visit: Payer: Self-pay

## 2017-11-30 ENCOUNTER — Encounter: Payer: Self-pay | Admitting: Internal Medicine

## 2017-11-30 ENCOUNTER — Ambulatory Visit (INDEPENDENT_AMBULATORY_CARE_PROVIDER_SITE_OTHER): Payer: Self-pay | Admitting: Internal Medicine

## 2017-11-30 VITALS — BP 148/59 | HR 72 | Temp 98.0°F | Ht 60.0 in | Wt 113.5 lb

## 2017-11-30 DIAGNOSIS — K047 Periapical abscess without sinus: Secondary | ICD-10-CM

## 2017-11-30 DIAGNOSIS — I10 Essential (primary) hypertension: Secondary | ICD-10-CM

## 2017-11-30 DIAGNOSIS — K5900 Constipation, unspecified: Secondary | ICD-10-CM

## 2017-11-30 DIAGNOSIS — M542 Cervicalgia: Secondary | ICD-10-CM | POA: Insufficient documentation

## 2017-11-30 DIAGNOSIS — R87612 Low grade squamous intraepithelial lesion on cytologic smear of cervix (LGSIL): Secondary | ICD-10-CM

## 2017-11-30 DIAGNOSIS — E119 Type 2 diabetes mellitus without complications: Secondary | ICD-10-CM

## 2017-11-30 HISTORY — DX: Cervicalgia: M54.2

## 2017-11-30 LAB — GLUCOSE, CAPILLARY: Glucose-Capillary: 102 mg/dL — ABNORMAL HIGH (ref 65–99)

## 2017-11-30 MED ORDER — LISINOPRIL 20 MG PO TABS: 10.0000 mg | ORAL_TABLET | Freq: Every day | ORAL | 0 refills | Status: DC

## 2017-11-30 NOTE — Assessment & Plan Note (Signed)
Well-controlled.  Did not check A1c today.  Continue metformin 500 mg daily.  Check A1c at next visit if at 45-month mark.  Would also like to get retinal photo for this patient.  She is overdue for her yearly diabetic eye exam.  Butch Penny was unavailable today.  Please try to get retinal fundus photo at next visit.

## 2017-11-30 NOTE — Progress Notes (Signed)
   CC: Hypertension, diabetes  HPI:  [name] is a 77 y.o. female with past medical history documented as below presenting for follow-up of hypertension and diabetes.  Please see encounter based charting for a detailed description of the patient's acute and chronic medical problems.  Past Medical History:  Diagnosis Date  . 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 Chronic cough    with negative chest CT  . Diabetes mellitus    history of last HA1C 05/2013 6.1, over the years been <6.5   . Hyperlipidemia   . Hypertension   . LSIL (low grade squamous intraepithelial lesion) on Pap smear    04/2013   . Osteoporosis    Dexa scan 4/09, starting alendronate 6/09; DEXA 01/29/2013 with T score ranges -2.5 to -3.4 rec repeat DEXA in 1 yr  . 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   HENT: Negative.   Eyes: Negative.   Respiratory: Negative for shortness of breath.   Cardiovascular: Negative for chest pain.  Gastrointestinal: Positive for constipation.  Genitourinary: Negative.     Physical Exam:  Vitals:   11/30/17 1432 11/30/17 1523  BP: (!) 166/56 (!) 148/59  Pulse: 72   Temp: 98 F (36.7 C)   TempSrc: Oral   SpO2: 100%   Weight: 113 lb 8 oz (51.5 kg)   Height: 5' (1.524 m)    Physical Exam  Constitutional: She is oriented to person, place, and time. She appears well-developed and well-nourished. No distress.  HENT:  Head: Normocephalic and atraumatic.  Mouth/Throat: Abnormal dentition. Dental caries present.  Poor dentition.  No evidence of acute dental infection.  Eyes: Conjunctivae are normal. No scleral icterus.  Neck: Normal range of motion. Neck supple.  Cardiovascular: Normal rate, regular rhythm and normal heart sounds.  Pulmonary/Chest: Effort normal and breath sounds normal.    Abdominal: Soft. Bowel sounds are normal. There is no tenderness.  Musculoskeletal: Normal range of motion. She exhibits no edema.  Neck: Range of motion intact.  No tenderness to palpation.  Tenderness with flexion to the left and looking up.  Neurological: She is alert and oriented to person, place, and time. No cranial nerve deficit.  Skin: Skin is warm and dry.    Assessment & Plan:   See Encounters Tab for problem based charting.  Patient discussed with Dr. Angelia Mould

## 2017-11-30 NOTE — Assessment & Plan Note (Signed)
Recommended daily MiraLAX to help regulate the patient.  Explained to the patient and granddaughter that she must take it daily to soften her stools.  Also encouragedincreased p.o. water intake.

## 2017-11-30 NOTE — Assessment & Plan Note (Signed)
Patient states that 4 days ago she began having neck pain.  Pain in her neck is worse with side flexion to the left and upward gaze.  She does not remember an inciting event.  Her range of motion is intact.  She takes Tylenol which relieves the pain.  Pain is already improving.  Likely experiencing muscle strain.  Advised the patient to continue Tylenol as needed and recommended over-the-counter Aspercreme to apply topically.  Exam was nonfocal.

## 2017-11-30 NOTE — Assessment & Plan Note (Signed)
BP Readings from Last 3 Encounters:  11/30/17 (!) 148/59  09/28/17 (!) 150/60  06/08/17 (!) 174/84   Blood pressure has been consistently elevated.  Currently on lisinopril 10 mg, and she has taken the medicine before office visit today.  Goal for this patient would ideally be 140/80.   Plan: -Will increase lisinopril 10 mg daily -Return to clinic in 1 month for blood pressure check -We will consider any BMP as patient has not had basic labs in about a year and I have increased her dose of lisinopril

## 2017-11-30 NOTE — Patient Instructions (Signed)
Connie Powell,   It was a pleasure seeing you today.   I have increased your Lisinopril from 10 mg daily to 20 mg daily. You can take 2 tablets of your 10 mg Lisinopril prescription until you run out. I have sent a new prescription to your pharmacy.   You do need to have a repeat pap smear.   I would like you to return to clinic in 2-3 weeks for a blood pressure check and a repeat pap smear.   For you neck pain, continue to take Tylenol as needed. You can also pick up a cream called Trolamine Salicylate or ASPERCEME Pain Relieving Creme. You can place this on you neck as needed for pain relief.   I also recommend taking Miralax daily to help your constipation. This can be picked up over the counter.

## 2017-11-30 NOTE — Assessment & Plan Note (Signed)
Patient had dental surgery, which was uncomplicated.  She is no longer taking amoxicillin or metronidazole.  Her mouth pain has overall resolved.  On oral exam today I did not appreciate any signs of acute infection.  Will continue to monitor.

## 2017-11-30 NOTE — Assessment & Plan Note (Addendum)
Patient was accompanied by her granddaughter today, which made it easier to get a history regarding her abnormal Pap.  On chart review, in April 2016 the patient had an abnormal Pap. LOW GRADE SQUAMOUS INTRAEPITHELIAL LESION: CIN-1/ HPV (LSIL).  This was subsequently followed up with a colposcopy June 2016.  Biopsy from the colposcopy was negative for malignancy.  Was recommended the patient have a follow-up Pap smear 6 months after this colposcopy.  Per granddaughter, the patient did not want to have another Pap smear and chose not to follow-up. She has not been following at [facility].  I discussed with the granddaughter and the patient that a Pap smear will be recommended due to her previous abnormal Pap smear.  The patient is willing to have a Pap smear in the future but not at this visit.  I have asked the granddaughter and the patient to follow-up in clinic in 3 to 4 weeks to have a repeat Pap smear. The patient and granddaughter were agreeable to this plan.

## 2017-12-01 NOTE — Progress Notes (Signed)
Internal Medicine Clinic Attending  Case discussed with Dr. LaCroce at the time of the visit.  We reviewed the resident's history and exam and pertinent patient test results.  I agree with the assessment, diagnosis, and plan of care documented in the resident's note.  

## 2018-01-16 ENCOUNTER — Other Ambulatory Visit: Payer: Self-pay | Admitting: Internal Medicine

## 2018-01-23 ENCOUNTER — Encounter: Payer: Self-pay | Admitting: *Deleted

## 2018-04-12 ENCOUNTER — Ambulatory Visit (INDEPENDENT_AMBULATORY_CARE_PROVIDER_SITE_OTHER): Payer: Self-pay | Admitting: Internal Medicine

## 2018-04-12 ENCOUNTER — Other Ambulatory Visit: Payer: Self-pay

## 2018-04-12 VITALS — BP 147/75 | HR 68 | Temp 98.3°F | Ht 60.0 in | Wt 117.4 lb

## 2018-04-12 DIAGNOSIS — E119 Type 2 diabetes mellitus without complications: Secondary | ICD-10-CM

## 2018-04-12 DIAGNOSIS — I10 Essential (primary) hypertension: Secondary | ICD-10-CM

## 2018-04-12 LAB — POCT GLYCOSYLATED HEMOGLOBIN (HGB A1C): HEMOGLOBIN A1C: 7.6 % — AB (ref 4.0–5.6)

## 2018-04-12 LAB — GLUCOSE, CAPILLARY: Glucose-Capillary: 71 mg/dL (ref 70–99)

## 2018-04-12 MED ORDER — LISINOPRIL 20 MG PO TABS: 20.0000 mg | ORAL_TABLET | Freq: Every day | ORAL | 2 refills | Status: DC

## 2018-04-12 MED ORDER — METFORMIN HCL 500 MG PO TABS: 500.0000 mg | ORAL_TABLET | Freq: Every day | ORAL | 0 refills | Status: DC

## 2018-04-12 NOTE — Patient Instructions (Signed)
Ms. Macfarlane, It was a pleasure meeting you and your family today! I am glad you are doing well. We are checking you A1C and kidney function today. I will call you when I have those results. Please start taking Lisinopril 20 mg daily to get better control of your blood pressure. Continue taking Metformin 500 mg daily. We will plan on seeing you back in 3 months for follow-up with your PCP.   Take care! Please call with any questions or concerns.

## 2018-04-12 NOTE — Progress Notes (Signed)
   CC: DM, HTN  HPI:  [name] is a 77 y.o. female with PMHx of HTN and DM who presents for follow-up on her chronic conditions. Please see encounter based charting for a detailed description of the patient's acute and chronic medical problems.    Past Medical History:  Diagnosis Date  . 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 Chronic cough    with negative chest CT  . Diabetes mellitus    history of last HA1C 05/2013 6.1, over the years been <6.5   . Hyperlipidemia   . Hypertension   . LSIL (low grade squamous intraepithelial lesion) on Pap smear    04/2013   . Osteoporosis    Dexa scan 4/09, starting alendronate 6/09; DEXA 01/29/2013 with T score ranges -2.5 to -3.4 rec repeat DEXA in 1 yr  . 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   Review of Systems:   Constitutional: denies fevers, chills, headaches CV: no chest pain, palpitations Resp: no cough, shortness of breath GI: no abdominal pain or changes in bowel  habits Ext: endorses occasional joint pain  GU: no urinary symptoms   Physical Exam:  Vitals:   04/12/18 1355  BP: (!) 147/75  Pulse: 68  Temp: 98.3 F (36.8 C)  TempSrc: Oral  SpO2: 100%  Weight: 117 lb 6.4 oz (53.3 kg)  Height: 5' (1.524 m)   General: alert, pleasant female, appears stated age, in NAD Neck: supple, no thyromegaly CV: RRR; no murmurs, rubs or gallops Pulm: normal respiratory effort; lungs CTA bilaterally Abd: BS+; abdomen soft, non-distended, non-tender Ext: no edema MSK: mild ulnar deviation of bilateral hands  Skin: warm and dry Psych: normal mood and affect   Assessment & Plan:   See Encounters Tab for problem based charting.  Patient seen with Dr. Eppie Gibson

## 2018-04-13 ENCOUNTER — Encounter: Payer: Self-pay | Admitting: Internal Medicine

## 2018-04-13 LAB — BMP8+ANION GAP
ANION GAP: 18 mmol/L (ref 10.0–18.0)
BUN/Creatinine Ratio: 25 (ref 12–28)
BUN: 23 mg/dL (ref 8–27)
CO2: 24 mmol/L (ref 20–29)
CREATININE: 0.91 mg/dL (ref 0.57–1.00)
Calcium: 9.8 mg/dL (ref 8.7–10.3)
Chloride: 102 mmol/L (ref 96–106)
GFR calc Af Amer: 70 mL/min/{1.73_m2} (ref >59)
GFR calc non Af Amer: 61 mL/min/{1.73_m2} (ref >59)
Glucose: 120 mg/dL — ABNORMAL HIGH (ref 65–99)
POTASSIUM: 4.6 mmol/L (ref 3.5–5.2)
SODIUM: 144 mmol/L (ref 134–144)

## 2018-04-13 NOTE — Assessment & Plan Note (Signed)
A1C increased from 7.1 to 7.6. Consider increasing Metformin at next visit, given she is only on 500 mg daily. Also due for diabetic eye exam at next visit with PCP.

## 2018-04-13 NOTE — Assessment & Plan Note (Addendum)
Blood pressure has been persistently elevated on last few visits. She denies headaches, vision changes, chest pain, or shortness of breath. Will increase Lisinopril from 10 mg to 20 mg to attempt better blood pressure control and re-evaluate at follow-up visit with PCP.

## 2018-04-14 NOTE — Progress Notes (Signed)
Patient ID: Connie Powell, female   DOB: Sep 05, 1940, 77 y.o.   MRN: [id number]  I saw and evaluated the patient.  I personally confirmed the key portions of Dr. Janne Napoleon history and exam and reviewed pertinent patient test results.  The assessment, diagnosis, and plan were formulated together and I agree with the documentation in the resident's note.

## 2018-04-14 NOTE — Addendum Note (Signed)
Addended by: Oval Linsey D on: 04/14/2018 05:25 PM   Modules accepted: Level of Service

## 2018-07-05 ENCOUNTER — Other Ambulatory Visit: Payer: Self-pay | Admitting: Internal Medicine

## 2018-07-05 DIAGNOSIS — E119 Type 2 diabetes mellitus without complications: Secondary | ICD-10-CM

## 2018-07-05 DIAGNOSIS — I10 Essential (primary) hypertension: Secondary | ICD-10-CM

## 2018-07-05 MED ORDER — METFORMIN HCL 500 MG PO TABS: 500.0000 mg | ORAL_TABLET | Freq: Every day | ORAL | 0 refills | Status: DC

## 2018-07-05 MED ORDER — LISINOPRIL 20 MG PO TABS: 20.0000 mg | ORAL_TABLET | Freq: Every day | ORAL | 0 refills | Status: DC

## 2018-07-10 ENCOUNTER — Other Ambulatory Visit: Payer: Self-pay

## 2018-07-10 NOTE — Telephone Encounter (Signed)
Talked to [facility] - informed rxs were refilled 07/06/18 and sent to [facility] in [place]. She was able to pull up the rxs and will get them ready for the pt.

## 2018-07-10 NOTE — Telephone Encounter (Signed)
Requesting a refill on   lisinopril (PRINIVIL,ZESTRIL) 20 MG tablet,   metFORMIN (GLUCOPHAGE) 500 MG tablet@  [facility] [address] [place] - [address] [phone] (Phone) [phone] (Fax)

## 2018-07-11 ENCOUNTER — Other Ambulatory Visit: Payer: Self-pay

## 2018-07-11 ENCOUNTER — Ambulatory Visit (INDEPENDENT_AMBULATORY_CARE_PROVIDER_SITE_OTHER): Payer: Self-pay | Admitting: Internal Medicine

## 2018-07-11 ENCOUNTER — Encounter: Payer: Self-pay | Admitting: Internal Medicine

## 2018-07-11 DIAGNOSIS — Z79899 Other long term (current) drug therapy: Secondary | ICD-10-CM

## 2018-07-11 DIAGNOSIS — K5900 Constipation, unspecified: Secondary | ICD-10-CM

## 2018-07-11 DIAGNOSIS — I10 Essential (primary) hypertension: Secondary | ICD-10-CM

## 2018-07-11 DIAGNOSIS — Z7984 Long term (current) use of oral hypoglycemic drugs: Secondary | ICD-10-CM

## 2018-07-11 DIAGNOSIS — E119 Type 2 diabetes mellitus without complications: Secondary | ICD-10-CM

## 2018-07-11 DIAGNOSIS — R011 Cardiac murmur, unspecified: Secondary | ICD-10-CM

## 2018-07-11 MED ORDER — METFORMIN HCL 500 MG PO TABS: 500.0000 mg | ORAL_TABLET | Freq: Two times a day (BID) | ORAL | 0 refills | Status: DC

## 2018-07-11 MED ORDER — AMLODIPINE BESYLATE 5 MG PO TABS: 5.0000 mg | ORAL_TABLET | Freq: Every day | ORAL | 0 refills | Status: DC

## 2018-07-11 MED ORDER — LISINOPRIL 20 MG PO TABS: 20.0000 mg | ORAL_TABLET | Freq: Every day | ORAL | 0 refills | Status: DC

## 2018-07-11 NOTE — Assessment & Plan Note (Signed)
Lisinpril escalated to 20 mg QD on last visit. BP still elevated at 156/56--> Repeat:150/50 today. Per chart she has had elevated BP on Lisinopril. Seems not responding well on Lisinopril Monotherapy.   -Add Amlodipin 5 mg QD -Continue Lisinopril 20 mg QD -BMP today -F/u in 1 month

## 2018-07-11 NOTE — Progress Notes (Signed)
   CC: ED visit follow up  HPI:  [name] is a 78 y.o. female with past medical history listed below, came to clinic today for ED visit  follow-up. Per grand daughter she was seen in ED in [place],  about 1 month ago for not being herself and high blood sugar at 300 at home. For further details, and assessment and plan, please refer to problem based charting.  Past Medical History:  Diagnosis Date  . 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 Chronic cough    with negative chest CT  . Diabetes mellitus    history of last HA1C 05/2013 6.1, over the years been <6.5   . Hyperlipidemia   . Hypertension   . LSIL (low grade squamous intraepithelial lesion) on Pap smear    04/2013   . Osteoporosis    Dexa scan 4/09, starting alendronate 6/09; DEXA 01/29/2013 with T score ranges -2.5 to -3.4 rec repeat DEXA in 1 yr  . 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  Respiratory: Negative for cough and shortness of breath.   Cardiovascular: Negative for chest pain and leg swelling.  Gastrointestinal: Positive for constipation. Negative for abdominal pain, diarrhea, nausea and vomiting.  Genitourinary: Negative for dysuria, frequency and urgency.  Neurological: Negative for dizziness and headaches.  Psychiatric/Behavioral: Negative for depression.   Physical Exam:  Vitals:   07/11/18 1515  BP: (!) 156/59  Pulse: 69  Temp: (!) 97.4 F (36.3 C)  TempSrc: Oral  SpO2: 100%  Weight: 118 lb 6.4 oz (53.7 kg)  Height: 5' (1.524 m)   Physical Exam Constitutional:      Appearance: Normal appearance. She is not ill-appearing.  HENT:   Head: Normocephalic and atraumatic.  Eyes:     Extraocular Movements: Extraocular movements intact.  Cardiovascular:     Rate and Rhythm: Normal rate and regular rhythm.     Pulses: Normal pulses.     Heart sounds: II-III/VI systolic Murmur present at Pulmonary area.  Pulmonary:     Effort: Pulmonary effort is normal. No respiratory distress.     Breath sounds: Normal breath sounds.  Abdominal:     Palpations: Abdomen is soft.     Tenderness: There is no abdominal tenderness.  Musculoskeletal: Normal range of motion.        General: No swelling or tenderness.  Skin:    General: Skin is warm and dry.  Neurological:     General: No focal deficit present.     Mental Status: She is alert and oriented to person, place, and time.  Psychiatric:        Mood and Affect: Mood normal.        Behavior: Behavior normal.    Assessment & Plan:   See Encounters Tab for problem based charting.  Patient discussed with Dr. Lynnae January

## 2018-07-11 NOTE — Patient Instructions (Signed)
Thank you for allowing [place] to provide your care today. Your blood pressure is still  elevated at 150/56 today despite increasing Lisinopril on last visit.  As we discussed I add Amlodipine 5 mg daily. Please take it and follow up in clinic in 1 month. I also refilled your Metformin and Lisinopril. Please take them as before. I have ordered blood test for you. I will call if any are abnormal.   We do diabetes and nutrition referral for you to do eye exam and provide you glucometer lancet.   Please follow-up in a month. Should you have any questions or concerns please call the internal medicine clinic at [phone].    Thanks

## 2018-07-11 NOTE — Assessment & Plan Note (Addendum)
Patient was seen at ED in [place] about a month ago for high blood glucose at 300. (Confirmed by grand daugther but no document available at chart). Metformin increased to 500 mg BID since then.  BG at home between 140- 160 since then. Patient has asymptomatic.  -HbA1c -Continue Metformin 500 mg BID (Refilled) -Referral to Nutrition and Diabetes service for ophthalmology exam and providing glucometer lancer per patient's request  *ADDENDUM--> Unable to obtain retinal images at our center. Patient will get an appointment at [facility] where she had her cataract surgery done by Dr Susa Simmonds. Family agrees with plan.  -Ambulatory referral to [facility] eye

## 2018-07-12 LAB — BMP8+ANION GAP
ANION GAP: 16 mmol/L (ref 10.0–18.0)
BUN/Creatinine Ratio: 16 (ref 12–28)
BUN: 15 mg/dL (ref 8–27)
CHLORIDE: 102 mmol/L (ref 96–106)
CO2: 22 mmol/L (ref 20–29)
Calcium: 9.4 mg/dL (ref 8.7–10.3)
Creatinine, Ser: 0.95 mg/dL (ref 0.57–1.00)
GFR calc Af Amer: 67 mL/min/{1.73_m2} (ref >59)
GFR calc non Af Amer: 58 mL/min/{1.73_m2} — ABNORMAL LOW (ref >59)
Glucose: 218 mg/dL — ABNORMAL HIGH (ref 65–99)
POTASSIUM: 4.7 mmol/L (ref 3.5–5.2)
SODIUM: 140 mmol/L (ref 134–144)

## 2018-07-12 LAB — HEMOGLOBIN A1C
ESTIMATED AVERAGE GLUCOSE: 194 mg/dL
Hgb A1c MFr Bld: 8.4 % — ABNORMAL HIGH (ref 4.8–5.6)

## 2018-07-12 NOTE — Progress Notes (Signed)
Internal Medicine Clinic Attending  Case discussed with Dr. Masoudi  at the time of the visit.  We reviewed the resident's history and exam and pertinent patient test results.  I agree with the assessment, diagnosis, and plan of care documented in the resident's note.  

## 2018-07-18 NOTE — Progress Notes (Addendum)
Unable to obtain retinal images. Family member agreed to make her an appointment with [facility] who did her cataract surgery. Family member also asked for information on meal planning and carb counting. Carb counting and meal planning for diabetes book provided.   Butch Penny Plyler, RD 07/18/2018 9:01 AM.

## 2018-07-18 NOTE — Addendum Note (Signed)
Addended byDewayne Hatch on: 07/18/2018 07:59 PM   Modules accepted: Orders

## 2018-07-20 ENCOUNTER — Telehealth: Payer: Self-pay | Admitting: *Deleted

## 2018-07-20 NOTE — Telephone Encounter (Signed)
LEFT VOICE MESSAGE FOR [name] REGARDING THE EYE REFERRAL FOR HER GRAND MOTHER - [name].  SHE WILL NEED TO CALL THE OFFICE AT [phone] REGARDING THE COST / [place]. FOR VISIT. REFERRAL FAXED  SO I THEY ACCEPT THE APPOINTMENT THE INFORMATION IS THERE. PATIENT HAS NO COVERAGE.

## 2018-09-06 ENCOUNTER — Ambulatory Visit: Payer: Self-pay

## 2018-09-13 ENCOUNTER — Ambulatory Visit: Payer: Self-pay | Admitting: Dietician

## 2018-09-15 ENCOUNTER — Other Ambulatory Visit: Payer: Self-pay

## 2018-09-15 ENCOUNTER — Encounter: Payer: Self-pay | Admitting: *Deleted

## 2018-09-15 ENCOUNTER — Ambulatory Visit: Payer: Self-pay | Admitting: Internal Medicine

## 2018-09-15 ENCOUNTER — Encounter: Payer: Self-pay | Admitting: Internal Medicine

## 2018-09-15 DIAGNOSIS — Z79899 Other long term (current) drug therapy: Secondary | ICD-10-CM

## 2018-09-15 DIAGNOSIS — I10 Essential (primary) hypertension: Secondary | ICD-10-CM

## 2018-09-15 MED ORDER — AMLODIPINE BESYLATE 5 MG PO TABS: 5.0000 mg | ORAL_TABLET | Freq: Every day | ORAL | 0 refills | Status: DC

## 2018-09-15 MED ORDER — AMLODIPINE BESYLATE 5 MG PO TABS: 5.0000 mg | ORAL_TABLET | Freq: Every day | ORAL | 3 refills | Status: DC

## 2018-09-15 MED ORDER — LISINOPRIL 20 MG PO TABS: 20.0000 mg | ORAL_TABLET | Freq: Every day | ORAL | 0 refills | Status: DC

## 2018-09-15 NOTE — Patient Instructions (Addendum)
[name],  It was a pleasure taking care of you here at the clinic today and also very glad seeing you.  Here are my recommendations after today's visit:  1.  I have given you a refill of both lisinopril and amlodipine 2.  I would like for you to continue checking your blood pressure at home.  My goal blood pressure for you is 150/90.  3.  If the blood pressure remains consistently elevated above 150/90, I will need to give our clinic a call and we can add another medication  Take care  Dr. Eileen Stanford  Please call the internal medicine center clinic if you have any questions or concerns, we may be able to help and keep you from a long and expensive emergency room wait. Our clinic and after hours phone number is [phone], the best time to call is Monday through Friday 9 am to 4 pm but there is always someone available 24/7 if you have an emergency. If you need medication refills please notify your pharmacy one week in advance and they will send [place] a request.

## 2018-09-15 NOTE — Assessment & Plan Note (Signed)
Hypertension: Ms. Monfils has a longstanding history of hypertension that has been above goal.  She was last evaluated by Dr. Sydnee Levans on July 11, 2018 for which amlodipine 5 mg daily was added to her medication in addition to the already uptitrated lisinopril 20 mg daily.  Unfortunately, she has been out of her medication for about a month now.  Blood pressure at the clinic was elevated in the 180s/60s however she is completely asymptomatic and denies headaches, chest pain, shortness of breath or weakness.  BP Readings from Last 3 Encounters:  09/15/18 (!) 186/62  07/11/18 (!) 156/59  04/12/18 (!) 147/75   Plan: -Goal BP is <150/90 - Refill for amlodipine 5 mg daily has been given (#3 refills) - Refill for lisinopril 20 mg - Her daughter Fredderick Severance) with was advised to continue checking her home blood pressure and if it consistently stays above 150/90, she should give [place] a call to consider adding another medication (thiazide diuretic). - Follow-up with the clinic in 1 to 3 months.

## 2018-09-15 NOTE — Progress Notes (Signed)
   CC: Follow-up hypertension  HPI:  [name] is a 78 y.o. woman with medical history listed below presenting to follow-up hypertension  Please see problem based charting for further details  Past Medical History:  Diagnosis Date  . 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 Chronic cough    with negative chest CT  . Diabetes mellitus    history of last HA1C 05/2013 6.1, over the years been <6.5   . Hyperlipidemia   . Hypertension   . LSIL (low grade squamous intraepithelial lesion) on Pap smear    04/2013   . Osteoporosis    Dexa scan 4/09, starting alendronate 6/09; DEXA 01/29/2013 with T score ranges -2.5 to -3.4 rec repeat DEXA in 1 yr  . 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  Respiratory: Negative for shortness of breath.   Cardiovascular: Negative for chest pain and leg swelling.  Gastrointestinal: Negative for abdominal pain, nausea and vomiting.  Skin: Negative for rash.  Neurological: Negative for dizziness, focal weakness, weakness and headaches.  Psychiatric/Behavioral: Negative for depression.    Physical Exam:  Vitals:   09/15/18 1405  BP: (!) 186/62  Pulse: 79  Temp: 98.6 F (37 C)  TempSrc: Oral  SpO2: 99%  Weight: 117 lb 6.4 oz (53.3 kg)  Height: 5' (1.524 m)   Physical Exam Vitals signs and nursing note reviewed.  Constitutional:      General: She is not in acute distress.    Appearance: Normal appearance. She is not toxic-appearing.  HENT:     Head: Normocephalic and atraumatic.  Cardiovascular:     Rate and Rhythm: Normal rate and regular rhythm.     Pulses: Normal pulses.     Heart sounds: Normal heart sounds. No murmur.  Pulmonary:     Effort: Pulmonary effort is normal.     Breath sounds: Normal breath sounds.  Abdominal:     General:  Bowel sounds are normal.     Tenderness: There is no abdominal tenderness.  Musculoskeletal:     Right lower leg: No edema.     Left lower leg: No edema.  Skin:    General: Skin is warm.  Neurological:     Mental Status: She is alert.  Psychiatric:        Mood and Affect: Mood normal.        Behavior: Behavior normal.     Assessment & Plan:   See Encounters Tab for problem based charting.  Patient discussed with Dr. Angelia Mould

## 2018-09-18 NOTE — Progress Notes (Signed)
Internal Medicine Clinic Attending  Case discussed with Dr. Agyei at the time of the visit.  We reviewed the resident's history and exam and pertinent patient test results.  I agree with the assessment, diagnosis, and plan of care documented in the resident's note.    

## 2018-09-19 ENCOUNTER — Telehealth: Payer: Self-pay | Admitting: Pharmacist

## 2018-09-19 DIAGNOSIS — Z0000 Encounter for general adult medical examination without abnormal findings: Secondary | ICD-10-CM

## 2018-09-19 MED ORDER — INFLUENZA VAC SPLIT QUAD 0.5 ML IM SUSY: 0.5000 mL | PREFILLED_SYRINGE | Freq: Once | INTRAMUSCULAR | 0 refills | Status: DC

## 2018-09-19 NOTE — Telephone Encounter (Signed)
Spoke to patient's daughter regarding influenza immunization, patient planning on receiving from [employer]. Notified Kristopher Oppenheim as well.

## 2018-11-21 ENCOUNTER — Encounter: Payer: Self-pay | Admitting: *Deleted

## 2018-12-06 ENCOUNTER — Encounter: Payer: Self-pay | Admitting: Internal Medicine

## 2018-12-07 ENCOUNTER — Ambulatory Visit (INDEPENDENT_AMBULATORY_CARE_PROVIDER_SITE_OTHER): Payer: Self-pay | Admitting: Internal Medicine

## 2018-12-07 ENCOUNTER — Other Ambulatory Visit: Payer: Self-pay

## 2018-12-07 ENCOUNTER — Encounter: Payer: Self-pay | Admitting: Internal Medicine

## 2018-12-07 VITALS — BP 129/51 | HR 84 | Temp 98.6°F | Ht 60.0 in | Wt 115.8 lb

## 2018-12-07 DIAGNOSIS — I10 Essential (primary) hypertension: Secondary | ICD-10-CM

## 2018-12-07 DIAGNOSIS — E119 Type 2 diabetes mellitus without complications: Secondary | ICD-10-CM

## 2018-12-07 DIAGNOSIS — Z79899 Other long term (current) drug therapy: Secondary | ICD-10-CM

## 2018-12-07 DIAGNOSIS — D3612 Benign neoplasm of peripheral nerves and autonomic nervous system, upper limb, including shoulder: Secondary | ICD-10-CM

## 2018-12-07 DIAGNOSIS — Z7984 Long term (current) use of oral hypoglycemic drugs: Secondary | ICD-10-CM

## 2018-12-07 DIAGNOSIS — R350 Frequency of micturition: Secondary | ICD-10-CM

## 2018-12-07 DIAGNOSIS — D3610 Benign neoplasm of peripheral nerves and autonomic nervous system, unspecified: Secondary | ICD-10-CM

## 2018-12-07 LAB — GLUCOSE, CAPILLARY: Glucose-Capillary: 157 mg/dL — ABNORMAL HIGH (ref 70–99)

## 2018-12-07 LAB — POCT GLYCOSYLATED HEMOGLOBIN (HGB A1C): Hemoglobin A1C: 7.2 % — AB (ref 4.0–5.6)

## 2018-12-07 NOTE — Assessment & Plan Note (Signed)
#  Type 2 diabetes mellitus: Denies any hypoglycemic episodes.  Home CBG monitoring between 100-1 50s.  A1c today at the clinic was 7.2% from 8.4% in January.  She has been very great with dietary modification.  Plan: -Continue metformin 500 mg twice daily

## 2018-12-07 NOTE — Assessment & Plan Note (Signed)
#  S/p resection of benign peripheral schwannoma: In 2018, she was noted to have an enlarging right axillary mass.  This was biopsied and was consistent with schwannoma and subsequently underwent right axillary mass excision with no recurrence of symptoms.  Pathology revealed benign schwannoma.  She states that over the past 3 months she has had discomfort/pain at the right axilla but has not caused any right upper extremity weakness, numbness, tingling.  Initially, the pain was worse with hyperflexion and extension of the wrist.  The discomfort has improved today at the office and has not limited her daily activities.  She has not noticed any mass at the right axilla.   Physical exams did not reveal any lymphadenopathy, palpable masses at the right axilla.  She did have an old surgical wound that was clean, dry and indurated.   Her discomfort could be secondary to postsurgical increased pain sensitivity versus musculoskeletal irritation.  Upon further reading, benign short numbers are less likely to recur.  She was advised to continue monitoring the pain and also examining her right axilla frequently.  If she begins to notice any palpable masses, she should contact me/[facility] clinic.

## 2018-12-07 NOTE — Patient Instructions (Addendum)
Ms. Klinedinst,   It was a pleasure taking care of you today at the clinic.  Your blood pressures very well controlled so please continue taking all your antihypertensives.  Your hemoglobin A1c is 7.2% so please continue taking your diabetes medications.  I will check on your urine to make sure there is no infection.  The right armpit pain you have been having could be due to being highly sensitive from the surgery at 2 years ago.  Since you told me you were not asymptomatic prior to a diagnosis of schwannoma, I would defer obtaining any images now continue to monitor.  Take care! Dr. Eileen Stanford

## 2018-12-07 NOTE — Assessment & Plan Note (Signed)
#  Hypertension:  BP Readings from Last 3 Encounters:  12/07/18 (!) 129/51  09/15/18 (!) 186/62  07/11/18 (!) 156/59   At goal today.    Plan: -Continue amlodipine 5 mg daily -Continue lisinopril 20 mg daily

## 2018-12-07 NOTE — Progress Notes (Addendum)
   CC: Right axilla pain   HPI:  ConnieConnie Powell is a 78 y.o. is a 78 year old very pleasant woman with medical history listed below presenting for evaluation of right axilla pain.   Please see problem based charting for further details.   Past Medical History:  Diagnosis Date  . 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 Chronic cough    with negative chest CT  . Diabetes mellitus    history of last HA1C 05/2013 6.1, over the years been <6.5   . Hyperlipidemia   . Hypertension   . LSIL (low grade squamous intraepithelial lesion) on Pap smear    04/2013   . Osteoporosis    Dexa scan 4/09, starting alendronate 6/09; DEXA 01/29/2013 with T score ranges -2.5 to -3.4 rec repeat DEXA in 1 yr  . 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  HENT:     Head: Normocephalic.  Neck:     Musculoskeletal: Normal range of motion and neck supple.  Cardiovascular:     Rate and Rhythm: Normal rate.     Heart sounds: Normal heart sounds. No murmur. No gallop.   Pulmonary:     Effort: Pulmonary effort is normal.     Breath sounds: Normal breath sounds. No wheezing or rales.  Skin:    Comments: -Right right axilla without palpable lymphadenopathy. -Old 2 cm horizontal surgical incision that is clean, dry, indurated. -No obvious trauma at the right axilla. - Range of motion unremarkable at the right upper extremity.  Neurological:     Mental Status: She is alert.     Assessment & Plan:   See Encounters Tab for problem based charting.  Patient discussed with Dr. Evette Doffing

## 2018-12-08 LAB — URINALYSIS, ROUTINE W REFLEX MICROSCOPIC
Bilirubin, UA: NEGATIVE
Nitrite, UA: NEGATIVE
Protein,UA: NEGATIVE
RBC, UA: NEGATIVE
Specific Gravity, UA: 1.026 (ref 1.005–1.030)
Urobilinogen, Ur: 0.2 mg/dL (ref 0.2–1.0)
pH, UA: 5 (ref 5.0–7.5)

## 2018-12-08 LAB — MICROSCOPIC EXAMINATION
Casts: NONE SEEN /lpf
Epithelial Cells (non renal): >10 /hpf — AB (ref 0–10)

## 2018-12-08 NOTE — Progress Notes (Signed)
Internal Medicine Clinic Attending  Case discussed with Dr. Agyei at the time of the visit.  We reviewed the resident's history and exam and pertinent patient test results.  I agree with the assessment, diagnosis, and plan of care documented in the resident's note.    

## 2018-12-14 ENCOUNTER — Other Ambulatory Visit: Payer: Self-pay | Admitting: Internal Medicine

## 2018-12-14 DIAGNOSIS — E119 Type 2 diabetes mellitus without complications: Secondary | ICD-10-CM

## 2018-12-14 DIAGNOSIS — I10 Essential (primary) hypertension: Secondary | ICD-10-CM

## 2018-12-14 MED ORDER — AMLODIPINE BESYLATE 5 MG PO TABS: 5.0000 mg | ORAL_TABLET | Freq: Every day | ORAL | 3 refills | Status: DC

## 2018-12-14 MED ORDER — LISINOPRIL 20 MG PO TABS: 20.0000 mg | ORAL_TABLET | Freq: Every day | ORAL | 0 refills | Status: DC

## 2018-12-14 MED ORDER — METFORMIN HCL 500 MG PO TABS: 500.0000 mg | ORAL_TABLET | Freq: Two times a day (BID) | ORAL | 0 refills | Status: DC

## 2018-12-15 ENCOUNTER — Encounter: Payer: Self-pay | Admitting: Internal Medicine

## 2018-12-22 ENCOUNTER — Telehealth: Payer: Self-pay | Admitting: *Deleted

## 2018-12-22 NOTE — Telephone Encounter (Signed)
CALLED PATIENT AND SPOKE WITH GRANDDAUGHTER Connie Powell / APPOINTMENT INFORMATION GIVE WITH PHONE NUMBER. PER OFFICE IF PATIENT DO NOT KEEP THIS APPOINTMENT IT WILL BE January BEFORE GETTING ANOTHER APPOINTMENT.

## 2019-03-14 ENCOUNTER — Other Ambulatory Visit: Payer: Self-pay

## 2019-03-14 ENCOUNTER — Ambulatory Visit: Payer: Self-pay

## 2019-03-14 ENCOUNTER — Encounter: Payer: Self-pay | Admitting: Internal Medicine

## 2019-03-14 ENCOUNTER — Ambulatory Visit (INDEPENDENT_AMBULATORY_CARE_PROVIDER_SITE_OTHER): Payer: Self-pay | Admitting: Internal Medicine

## 2019-03-14 VITALS — BP 148/58 | HR 78 | Temp 97.8°F | Ht 60.0 in | Wt 118.8 lb

## 2019-03-14 DIAGNOSIS — Z7984 Long term (current) use of oral hypoglycemic drugs: Secondary | ICD-10-CM

## 2019-03-14 DIAGNOSIS — E119 Type 2 diabetes mellitus without complications: Secondary | ICD-10-CM

## 2019-03-14 DIAGNOSIS — Z79899 Other long term (current) drug therapy: Secondary | ICD-10-CM

## 2019-03-14 DIAGNOSIS — I10 Essential (primary) hypertension: Secondary | ICD-10-CM

## 2019-03-14 DIAGNOSIS — Z8742 Personal history of other diseases of the female genital tract: Secondary | ICD-10-CM

## 2019-03-14 DIAGNOSIS — Z23 Encounter for immunization: Secondary | ICD-10-CM

## 2019-03-14 DIAGNOSIS — Z0000 Encounter for general adult medical examination without abnormal findings: Secondary | ICD-10-CM

## 2019-03-14 LAB — GLUCOSE, CAPILLARY: Glucose-Capillary: 236 mg/dL — ABNORMAL HIGH (ref 70–99)

## 2019-03-14 LAB — POCT GLYCOSYLATED HEMOGLOBIN (HGB A1C): Hemoglobin A1C: 8 % — AB (ref 4.0–5.6)

## 2019-03-14 MED ORDER — LISINOPRIL 20 MG PO TABS: 20.0000 mg | ORAL_TABLET | Freq: Every day | ORAL | 0 refills | Status: DC

## 2019-03-14 MED ORDER — FREESTYLE LANCETS MISC: 12 refills | Status: DC

## 2019-03-14 MED ORDER — METFORMIN HCL 500 MG PO TABS: 500.0000 mg | ORAL_TABLET | Freq: Two times a day (BID) | ORAL | 0 refills | Status: DC

## 2019-03-14 MED ORDER — AMLODIPINE BESYLATE 5 MG PO TABS: 5.0000 mg | ORAL_TABLET | Freq: Every day | ORAL | 3 refills | Status: DC

## 2019-03-14 NOTE — Assessment & Plan Note (Addendum)
Type 2 diabetes mellitus: She does not regularly monitor CBGs at home but does not report of any symptoms that will indicate hypoglycemia.  Last A1c was 7.2% in June.  My goal A1c is <8%.  Plan: - Will increase metformin 500 mg twice daily to 1000mg  twice a day.

## 2019-03-14 NOTE — Assessment & Plan Note (Signed)
Hypertension-fairly controlled: Goal BP is <150/90.  During my last visit with her several months ago her blood pressure was 129/51.  BP Readings from Last 3 Encounters:  03/14/19 (!) 148/58  12/07/18 (!) 129/51  09/15/18 (!) 186/62   Plan: -Continue amlodipine 5 mg daily and lisinopril 20 mg daily.

## 2019-03-14 NOTE — Assessment & Plan Note (Signed)
Health maintenance: -Foot exam today, flu vaccine today. - Deferred Pap smear (in 2016 she was found to have low-grade squamous intraepithelial lesion and follow-up colposcopy revealed no malignancy).

## 2019-03-14 NOTE — Progress Notes (Signed)
   CC: Follow up  Hypertension.  HPI:  [name] is a 78 y.o. very pleasant woman with medical history listed below presented to follow-up on chronic medical problems.  Please see problem based charting for further details.  Past Medical History:  Diagnosis Date  . 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 Chronic cough    with negative chest CT  . Constipation 06/02/2016  . Dental infection 09/29/2017  . Diabetes mellitus    history of last HA1C 05/2013 6.1, over the years been <6.5   . Fall 06/02/2016  . Hyperlipidemia   . Hypertension   . LSIL (low grade squamous intraepithelial lesion) on Pap smear    04/2013   . Neck pain 11/30/2017  . Osteoporosis    Dexa scan 4/09, starting alendronate 6/09; DEXA 01/29/2013 with T score ranges -2.5 to -3.4 rec repeat DEXA in 1 yr  . 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  Eyes: Conjunctivae are normal.  Cardiovascular: Normal rate, regular rhythm and normal heart sounds.  No murmur heard. Pulmonary/Chest: Effort normal and breath sounds normal. She has no wheezes. She has no rales.  Abdominal: Soft. Bowel sounds are normal. There is no abdominal tenderness.  Musculoskeletal: Normal range of motion.        General: No edema.  Psychiatric: Mood and affect normal.    Assessment & Plan:   See Encounters Tab for problem based charting.  Patient discussed with Dr. Dareen Piano

## 2019-03-14 NOTE — Patient Instructions (Addendum)
Ms. Alejandro,   It was a pleasure taking care of your the clinic today.  Overall, seems that you are doing very well.  Please continue taking all your medications.  I am going to check your hemoglobin A1c to see how the diabetes is doing.  Take care! Dr. Eileen Stanford  Please call the internal medicine center clinic if you have any questions or concerns, we may be able to help and keep you from a long and expensive emergency room wait. Our clinic and after hours phone number is [phone], the best time to call is Monday through Friday 9 am to 4 pm but there is always someone available 24/7 if you have an emergency. If you need medication refills please notify your pharmacy one week in advance and they will send [place] a request.

## 2019-03-15 NOTE — Addendum Note (Signed)
Addended by: Jean Rosenthal on: 03/15/2019 12:58 PM   Modules accepted: Level of Service

## 2019-03-15 NOTE — Progress Notes (Signed)
Internal Medicine Clinic Attending  Case discussed with Dr. Agyei at the time of the visit.  We reviewed the resident's history and exam and pertinent patient test results.  I agree with the assessment, diagnosis, and plan of care documented in the resident's note.    

## 2019-05-16 ENCOUNTER — Other Ambulatory Visit: Payer: Self-pay | Admitting: *Deleted

## 2019-05-16 DIAGNOSIS — E119 Type 2 diabetes mellitus without complications: Secondary | ICD-10-CM

## 2019-05-16 MED ORDER — FREESTYLE LANCETS MISC: 3 refills | Status: DC

## 2019-06-11 ENCOUNTER — Telehealth: Payer: Self-pay | Admitting: *Deleted

## 2019-06-11 NOTE — Telephone Encounter (Signed)
Thanks Helen  

## 2019-06-11 NOTE — Telephone Encounter (Signed)
Granddaughter calls and states pt has started having episodes of numbness going down one side of body. States she is able to perform ADL's on her own but that she finds this bothersome. She is ask to call 911 or take pt to ED, she refuses and insist on office visit. Offered [id number] 12/8 and refused. She states they have things to do. She is ask multiple times to call 911 or take pt to ED and refuses. She chooses 12/10 at 1015. She is ask if pt has more episodes or increased symptoms short of breath, chest pain, dizziness, weakness, change in speech, vision thoughts to call 911 again she states they will come to appt

## 2019-06-14 ENCOUNTER — Ambulatory Visit: Payer: Self-pay

## 2019-09-16 ENCOUNTER — Other Ambulatory Visit: Payer: Self-pay | Admitting: Internal Medicine

## 2019-09-16 DIAGNOSIS — E119 Type 2 diabetes mellitus without complications: Secondary | ICD-10-CM

## 2019-09-16 DIAGNOSIS — I10 Essential (primary) hypertension: Secondary | ICD-10-CM

## 2020-01-21 ENCOUNTER — Other Ambulatory Visit: Payer: Self-pay | Admitting: Internal Medicine

## 2020-01-21 DIAGNOSIS — I10 Essential (primary) hypertension: Secondary | ICD-10-CM

## 2020-01-22 ENCOUNTER — Other Ambulatory Visit: Payer: Self-pay | Admitting: Internal Medicine

## 2020-01-22 DIAGNOSIS — E119 Type 2 diabetes mellitus without complications: Secondary | ICD-10-CM

## 2020-01-22 DIAGNOSIS — I10 Essential (primary) hypertension: Secondary | ICD-10-CM

## 2020-01-22 MED ORDER — AMLODIPINE BESYLATE 5 MG PO TABS: 5.0000 mg | ORAL_TABLET | Freq: Every day | ORAL | 3 refills | Status: DC

## 2020-01-22 MED ORDER — METFORMIN HCL 1000 MG PO TABS: 1000.0000 mg | ORAL_TABLET | Freq: Two times a day (BID) | ORAL | 11 refills | Status: DC

## 2020-01-22 MED ORDER — METFORMIN HCL 500 MG PO TABS: 500.0000 mg | ORAL_TABLET | Freq: Two times a day (BID) | ORAL | 6 refills | Status: DC

## 2020-01-22 NOTE — Telephone Encounter (Signed)
Last visit 03/14/2019 with instructions to return in 88mths  will send refill request to appropriate team for review and appt request to front office .Despina Hidden Cassady7/20/20211:39 PM

## 2020-01-23 ENCOUNTER — Other Ambulatory Visit: Payer: Self-pay | Admitting: Internal Medicine

## 2020-01-23 DIAGNOSIS — I10 Essential (primary) hypertension: Secondary | ICD-10-CM

## 2020-01-24 ENCOUNTER — Other Ambulatory Visit: Payer: Self-pay | Admitting: Internal Medicine

## 2020-01-24 DIAGNOSIS — E119 Type 2 diabetes mellitus without complications: Secondary | ICD-10-CM

## 2020-01-24 DIAGNOSIS — I10 Essential (primary) hypertension: Secondary | ICD-10-CM

## 2020-01-24 MED ORDER — FREESTYLE LANCETS MISC: 3 refills | Status: AC

## 2020-01-24 MED ORDER — AMLODIPINE BESYLATE 5 MG PO TABS: 5.0000 mg | ORAL_TABLET | Freq: Every day | ORAL | 3 refills | Status: DC

## 2020-01-24 MED ORDER — METFORMIN HCL 1000 MG PO TABS: 1000.0000 mg | ORAL_TABLET | Freq: Two times a day (BID) | ORAL | 11 refills | Status: DC

## 2020-01-25 ENCOUNTER — Encounter: Payer: Self-pay | Admitting: Internal Medicine

## 2020-01-25 NOTE — Telephone Encounter (Signed)
Unable to reach patient.  Mailed letter to the patient to call the clinic and schedule an appointment as soon as possible.

## 2020-02-05 ENCOUNTER — Encounter: Payer: Self-pay | Admitting: Internal Medicine

## 2020-02-05 ENCOUNTER — Ambulatory Visit (INDEPENDENT_AMBULATORY_CARE_PROVIDER_SITE_OTHER): Payer: Self-pay | Admitting: Internal Medicine

## 2020-02-05 VITALS — BP 147/62 | HR 70 | Temp 97.8°F | Ht 60.0 in | Wt 117.0 lb

## 2020-02-05 DIAGNOSIS — M545 Low back pain, unspecified: Secondary | ICD-10-CM

## 2020-02-05 DIAGNOSIS — I10 Essential (primary) hypertension: Secondary | ICD-10-CM

## 2020-02-05 DIAGNOSIS — E119 Type 2 diabetes mellitus without complications: Secondary | ICD-10-CM

## 2020-02-05 LAB — POCT GLYCOSYLATED HEMOGLOBIN (HGB A1C): Hemoglobin A1C: 9.4 % — AB (ref 4.0–5.6)

## 2020-02-05 LAB — GLUCOSE, CAPILLARY: Glucose-Capillary: 241 mg/dL — ABNORMAL HIGH (ref 70–99)

## 2020-02-05 NOTE — Progress Notes (Signed)
94

## 2020-02-05 NOTE — Assessment & Plan Note (Addendum)
BP Readings from Last 3 Encounters:  02/05/20 (!) 147/62  03/14/19 (!) 148/58  12/07/18 (!) 129/51   Ms. Brindley states she has been able to take her amlodipine and lisinopril daily without any difficulty.  She denies any chest pain, both at rest and with exertion, shortness of breath, palpitations, dizziness, vision changes.  Assessment/plan: Previous goal set is BP less than 150/90.  She is within these goal parameters.  She may benefit from tighter control in the future, however will hold off at this time, as patient is hesitant to make numerous medication changes.   - Continue with current medication regimen: Amlodipine 5 mg, Lisinopril 20 mg

## 2020-02-05 NOTE — Assessment & Plan Note (Signed)
Lab Results  Component Value Date   HGBA1C 9.4 (A) 02/05/2020   Connie Powell states she has been taking Metformin 500 mg once per day for the past 1 year. She states she has the 1000 mg dosage at home but just hasn't started it. She notes that she has been having more dietary indiscretion lately. She is not regularly checking her sugars at home.   Assessment/Plan:  Diabetes has become uncontrolled in the past year with A1c increasing to 9.4%.  I suspect this is due to a combination of subtherapeutic dosing of Metformin, as well as dietary indiscretion.  Patient is very hesitant to add any more medications at this time, including combination pills, so we discussed transitioning to the maximum dosage of Metformin.  She is in agreement with this plan.  -Metformin 1000 mg twice daily -A1c in 3 months -BMP pending -Microalbumin pending -Diabetic eye exam with Butch Penny next visit

## 2020-02-05 NOTE — Assessment & Plan Note (Signed)
[name] endorses a history of intermittent right-sided lower back pain that does not radiate down her legs.  She notes that this pain comes on after exerting herself more than she is used to.  She denies any red flag symptoms at this time.  Pain is relieved with over-the-counter Tylenol.  Assessment/plan: Pain is described as very lateral in positioning.  Differential includes muscular origin from spasm versus arthritic pain.  Given that patient's pain is well controlled with Tylenol and mild in nature, without any red flag symptoms, will hold off on any additional intervention.  -Over-the-counter Tylenol as needed

## 2020-02-05 NOTE — Progress Notes (Signed)
   CC: Dm follow up  HPI:  [name] is a 79 y.o. with a PMHx of HTN, T2DM, HLD who presents to the clinic for DM follow up.   Please see the Encounters tab for problem-based Assessment & Plan regarding status of patient's acute and chronic conditions.  Past Medical History:  Diagnosis Date  . 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 Chronic cough    with negative chest CT  . Constipation 06/02/2016  . Dental infection 09/29/2017  . Diabetes mellitus    history of last HA1C 05/2013 6.1, over the years been <6.5   . Fall 06/02/2016  . Hyperlipidemia   . Hypertension   . LSIL (low grade squamous intraepithelial lesion) on Pap smear    04/2013   . Neck pain 11/30/2017  . Osteoporosis    Dexa scan 4/09, starting alendronate 6/09; DEXA 01/29/2013 with T score ranges -2.5 to -3.4 rec repeat DEXA in 1 yr  . 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  Respiratory: Negative for cough and shortness of breath.   Cardiovascular: Negative for chest pain, palpitations and leg swelling.  Gastrointestinal: Negative for abdominal pain, diarrhea, nausea and vomiting.  Musculoskeletal: Positive for back pain (right lower back pain ). Negative for joint pain.  Neurological: Negative for dizziness, focal weakness, weakness and headaches.   Physical Exam:  Vitals:   02/05/20 1532 02/05/20 1542  BP: (!) 170/63 (!) 147/62  Pulse: 77 70  Temp: 97.8 F (36.6 C)   TempSrc: Oral   SpO2: 100%   Weight: 117 lb (53.1 kg)   Height: 5' (1.524 m)    Physical Exam Vitals and nursing note reviewed.  Constitutional:      General: She is not in acute distress.    Appearance: She is normal weight.  Cardiovascular:     Rate and Rhythm: Normal rate and regular rhythm.     Heart sounds: No murmur heard.  No  gallop.   Pulmonary:     Effort: Pulmonary effort is normal. No respiratory distress.     Breath sounds: Normal breath sounds. No wheezing, rhonchi or rales.  Musculoskeletal:     Lumbar back: No deformity, tenderness or bony tenderness. Normal range of motion.     Right lower leg: No edema.     Left lower leg: No edema.  Skin:    General: Skin is warm and dry.  Neurological:     General: No focal deficit present.     Mental Status: She is alert and oriented to person, place, and time. Mental status is at baseline.  Psychiatric:        Mood and Affect: Mood normal.        Behavior: Behavior normal.    Assessment & Plan:   See Encounters Tab for problem based charting.  Patient discussed with Dr. Daryll Drown

## 2020-02-05 NOTE — Patient Instructions (Signed)
It was nice seeing you today! Thank you for choosing [facility] for your Primary Care.    Today we talked about:   1. Diabetes: Increase Metformin to 500 mg twice daily till current bottle is empty. At that time, increase to 1000 mg twice daily. Please work on decreasing daily carb intake as well. We will recheck A1c in 3 months  2. Continue with current dose of Lisinopril and Amlodipine daily 3. We check lab work today. We can discuss these at your next visit

## 2020-02-06 LAB — BMP8+ANION GAP
Anion Gap: 19 mmol/L — ABNORMAL HIGH (ref 10.0–18.0)
BUN/Creatinine Ratio: 16 (ref 12–28)
BUN: 15 mg/dL (ref 8–27)
CO2: 21 mmol/L (ref 20–29)
Calcium: 10 mg/dL (ref 8.7–10.3)
Chloride: 101 mmol/L (ref 96–106)
Creatinine, Ser: 0.96 mg/dL (ref 0.57–1.00)
GFR calc Af Amer: 66 mL/min/{1.73_m2} (ref >59)
GFR calc non Af Amer: 57 mL/min/{1.73_m2} — ABNORMAL LOW (ref >59)
Glucose: 269 mg/dL — ABNORMAL HIGH (ref 65–99)
Potassium: 4.6 mmol/L (ref 3.5–5.2)
Sodium: 141 mmol/L (ref 134–144)

## 2020-02-06 LAB — MICROALBUMIN / CREATININE URINE RATIO
Creatinine, Urine: 153.5 mg/dL
Microalb/Creat Ratio: 8 mg/g creat (ref 0–29)
Microalbumin, Urine: 12.5 ug/mL

## 2020-02-06 NOTE — Progress Notes (Signed)
Internal Medicine Clinic Attending  Case discussed with Dr. Basaraba  At the time of the visit.  We reviewed the resident's history and exam and pertinent patient test results.  I agree with the assessment, diagnosis, and plan of care documented in the resident's note.  

## 2020-09-01 ENCOUNTER — Encounter: Payer: Self-pay | Admitting: Internal Medicine

## 2020-09-08 ENCOUNTER — Ambulatory Visit: Payer: Self-pay | Admitting: Student

## 2020-09-08 ENCOUNTER — Encounter: Payer: Self-pay | Admitting: Student

## 2020-09-08 ENCOUNTER — Emergency Department (HOSPITAL_COMMUNITY)
Admission: EM | Admit: 2020-09-08 | Discharge: 2020-09-08 | Disposition: A | Discharge status: Home / Self Care | Payer: Self-pay | Attending: Emergency Medicine | Admitting: Emergency Medicine

## 2020-09-08 ENCOUNTER — Other Ambulatory Visit: Payer: Self-pay

## 2020-09-08 VITALS — BP 144/67 | HR 70 | Temp 98.6°F

## 2020-09-08 DIAGNOSIS — M7541 Impingement syndrome of right shoulder: Secondary | ICD-10-CM

## 2020-09-08 DIAGNOSIS — M545 Low back pain, unspecified: Secondary | ICD-10-CM

## 2020-09-08 DIAGNOSIS — Z5321 Procedure and treatment not carried out due to patient leaving prior to being seen by health care provider: Secondary | ICD-10-CM | POA: Insufficient documentation

## 2020-09-08 DIAGNOSIS — M79604 Pain in right leg: Secondary | ICD-10-CM | POA: Insufficient documentation

## 2020-09-08 DIAGNOSIS — M79605 Pain in left leg: Secondary | ICD-10-CM | POA: Insufficient documentation

## 2020-09-08 DIAGNOSIS — E785 Hyperlipidemia, unspecified: Secondary | ICD-10-CM

## 2020-09-08 DIAGNOSIS — E119 Type 2 diabetes mellitus without complications: Secondary | ICD-10-CM

## 2020-09-08 DIAGNOSIS — I10 Essential (primary) hypertension: Secondary | ICD-10-CM

## 2020-09-08 DIAGNOSIS — M79602 Pain in left arm: Secondary | ICD-10-CM | POA: Insufficient documentation

## 2020-09-08 DIAGNOSIS — Z0000 Encounter for general adult medical examination without abnormal findings: Secondary | ICD-10-CM

## 2020-09-08 DIAGNOSIS — M7542 Impingement syndrome of left shoulder: Secondary | ICD-10-CM

## 2020-09-08 LAB — GLUCOSE, CAPILLARY: Glucose-Capillary: 108 mg/dL — ABNORMAL HIGH (ref 70–99)

## 2020-09-08 LAB — POCT GLYCOSYLATED HEMOGLOBIN (HGB A1C): Hemoglobin A1C: 6.9 % — AB (ref 4.0–5.6)

## 2020-09-08 MED ORDER — NAPROXEN 500 MG PO TABS: 500.0000 mg | ORAL_TABLET | Freq: Two times a day (BID) | ORAL | 0 refills | Status: AC

## 2020-09-08 MED ORDER — BLOOD GLUCOSE MONITOR KIT: PACK | 0 refills | Status: AC

## 2020-09-08 MED ORDER — LISINOPRIL 40 MG PO TABS: 40.0000 mg | ORAL_TABLET | Freq: Every day | ORAL | 0 refills | Status: DC

## 2020-09-08 NOTE — ED Triage Notes (Signed)
Pt from home with son for eval of bilateral arm pain and bilateral leg pain, all worse with movement. Also is not sleeping well at night, maximum of two hours.

## 2020-09-08 NOTE — Progress Notes (Signed)
   CC: R shoulder pain  HPI:  [name] is a 80 y.o. with hypertension, osteoporosis, type II diabetes mellitus presenting to [facility] for shoulder pain.  Please see problem-based list for further details, assessments, and plans.  Past Medical History:  Diagnosis Date  . 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 Chronic cough    with negative chest CT  . Constipation 06/02/2016  . Dental infection 09/29/2017  . Diabetes mellitus    history of last HA1C 05/2013 6.1, over the years been <6.5   . Fall 06/02/2016  . Hyperlipidemia   . Hypertension   . LSIL (low grade squamous intraepithelial lesion) on Pap smear    04/2013   . Neck pain 11/30/2017  . Osteoporosis    Dexa scan 4/09, starting alendronate 6/09; DEXA 01/29/2013 with T score ranges -2.5 to -3.4 rec repeat DEXA in 1 yr  . 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 No murmurs, rubs, gallops. Pulm: Normal WOB. Clear to auscultation bilaterally.  MSK: Limited active and passive ROM on L shoulder. Hawkins test L shoulder positive. Empty can test positive on L. No tenderness on palpation bilateral shoulders. No pitting edema bilaterally.   Assessment & Plan:   See Encounters Tab for problem based charting.  Patient discussed with Dr. Dareen Piano

## 2020-09-08 NOTE — Assessment & Plan Note (Signed)
BP Readings from Last 3 Encounters:  09/08/20 (!) 144/67  09/08/20 (!) 142/60  02/05/20 (!) 147/62   BP similarly elevated to previous appointments. Reports compliance to medications. No chest pain, dyspnea, palpitations, dizziness, blurry vision. Plan to increase ACE given diabetes diagnosis. Will check kidney function, electrolytes today. Can increase amlodipine if need further control. - C/w amlodipine 5mg  daily - Increase lisinopril 40mg  qd

## 2020-09-08 NOTE — Patient Instructions (Signed)
Connie Powell,  It was a pleasure seeing you today!  Today we discussed your shoulder pain. I believe this is likely an impinged nerve. This will get better with time. I have prescribed a week's worth of pain medicine (naproxen). Take 500mg  twice daily for a week. I have also put in a referral to physical therapy to help with the pain.   We look forward to seeing you next time. Please call our clinic at [phone] if you have any questions or concerns. The best time to call is Monday-Friday from 9am-4pm, but there is someone available 24/7 at the same number. If you need medication refills, please notify your pharmacy one week in advance and they will send [place] a request.  Thank you for letting us take part in your care. Wishing you the best!  Thank you, Dr. Sanjuan Dame, MD

## 2020-09-08 NOTE — Assessment & Plan Note (Signed)
Patient presenting with one month history of progressively worsening right shoulder pain. Patient's grandson at bedside to assist in obtaining history. States at rest patient is asymptomatic, but pain occurs when she attempts to lift her arms. Mentions it is very difficult to lift her arm above her head. Also states it has been difficult to sleep due to the pain, especially when laying on that side. She often plays with her grandchildren, and this pain has limited this. No injury or trauma precipitating symptoms. Denies fevers, chills, skin changes, dyspnea, chest pain.  A/P: Physical exam and history most consistent with nerve impingement of right shoulder. Discussed with patient that physical therapy and supportive measures should help improve symptoms. If symptoms do not improve, can consider imaging and steroid injections. Instructed to return to clinic if symptoms do not improve over the next couple of weeks. - Referral for physical therapy - Rest, ice/warm compress - Naproxen 500mg  BID x7d

## 2020-09-08 NOTE — ED Notes (Signed)
Pt being moved off the floor. She was supposed to have an appointment with internal medicine @ 1:15, the family is taking her downstairs to try to still make the appointment.

## 2020-09-09 LAB — LIPID PANEL
Chol/HDL Ratio: 4.2 ratio (ref 0.0–4.4)
Cholesterol, Total: 206 mg/dL — ABNORMAL HIGH (ref 100–199)
HDL: 49 mg/dL (ref >39)
LDL Chol Calc (NIH): 143 mg/dL — ABNORMAL HIGH (ref 0–99)
Triglycerides: 76 mg/dL (ref 0–149)
VLDL Cholesterol Cal: 14 mg/dL (ref 5–40)

## 2020-09-10 ENCOUNTER — Other Ambulatory Visit: Payer: Self-pay | Admitting: Student

## 2020-09-10 DIAGNOSIS — E119 Type 2 diabetes mellitus without complications: Secondary | ICD-10-CM

## 2020-09-10 DIAGNOSIS — E785 Hyperlipidemia, unspecified: Secondary | ICD-10-CM

## 2020-09-10 MED ORDER — ATORVASTATIN CALCIUM 20 MG PO TABS: 20.0000 mg | ORAL_TABLET | Freq: Every day | ORAL | 11 refills | Status: DC

## 2020-09-10 NOTE — Assessment & Plan Note (Signed)
ADDENDUM: Lipid panel checked, current ASCVD 10-year risk 41%. Discussed with patient's granddaughter, who is unaware of self-discontinuation of simvastatin a few years ago. Discussed risks and benefits of starting cholesterol lowering medication, agreed to start Lipitor, but would prefer starting at lower dose. Can discuss increasing further at next visit. She also requested referral to nutritionist. - Start lipitor 20mg  qd - Referral for nutritionist

## 2020-09-10 NOTE — Progress Notes (Signed)
Internal Medicine Clinic Attending ? ?Case discussed with Dr. Braswell  At the time of the visit.  We reviewed the resident?s history and exam and pertinent patient test results.  I agree with the assessment, diagnosis, and plan of care documented in the resident?s note.  ?

## 2020-09-18 ENCOUNTER — Ambulatory Visit: Payer: Self-pay

## 2020-09-22 NOTE — Addendum Note (Signed)
Addended by: Hulan Fray on: 09/22/2020 09:19 AM   Modules accepted: Orders

## 2020-10-08 ENCOUNTER — Other Ambulatory Visit: Payer: Self-pay | Admitting: Student

## 2020-10-08 DIAGNOSIS — I10 Essential (primary) hypertension: Secondary | ICD-10-CM

## 2020-10-08 NOTE — Telephone Encounter (Signed)
Next appt scheduled 10/13/20.

## 2020-10-13 ENCOUNTER — Encounter: Payer: Self-pay | Admitting: Student

## 2020-10-16 ENCOUNTER — Encounter: Payer: Self-pay | Admitting: Internal Medicine

## 2020-10-16 ENCOUNTER — Other Ambulatory Visit: Payer: Self-pay

## 2020-10-16 ENCOUNTER — Ambulatory Visit (INDEPENDENT_AMBULATORY_CARE_PROVIDER_SITE_OTHER): Payer: Self-pay | Admitting: Internal Medicine

## 2020-10-16 VITALS — BP 141/50 | HR 77 | Temp 98.3°F | Ht <= 58 in | Wt 114.2 lb

## 2020-10-16 DIAGNOSIS — E785 Hyperlipidemia, unspecified: Secondary | ICD-10-CM

## 2020-10-16 DIAGNOSIS — I10 Essential (primary) hypertension: Secondary | ICD-10-CM

## 2020-10-16 DIAGNOSIS — M7541 Impingement syndrome of right shoulder: Secondary | ICD-10-CM

## 2020-10-16 NOTE — Assessment & Plan Note (Signed)
BP Readings from Last 3 Encounters:  10/16/20 (!) 141/50  09/08/20 (!) 144/67  09/08/20 (!) 142/60   Above goal. Currently on amlodipine, lisinopril. Lisinopril dose increased to 40mg  on last visit. Denies significant side effects. Denies chest pain, palpitations, blurry vision, headaches.  A/P Presenting with HTN. Above goal but family express concern regarding polypharmacy and increasing anti-hypertensive regimen. Discussed risk and benefit of aggressive bp control. Also discussed dietary choices and family mentions heavy salt intake. Will advise dietary changes. - Information provided on DASH diet - Bmp - C/w amlodipine 5mg  daily, lisinopril 40mg  daily

## 2020-10-16 NOTE — Assessment & Plan Note (Addendum)
Started on atorvastatin 20mg  daily on last visit due to ASCVD risk of 41%. Denies significant myalgias after starting statin therapy. Previously recommended high intensity statin but family refused due to concern of significant side effects.  A/P Discussed benefit of aggressively treating hyperlipidemia. Discussed increasing dose of statin therapy but family would like to trial lifestyle modifications with current statin dose prior to up-titrating therapy. - Recheck lipid panel at next visit - Information on dietary changes for hyperlipidemia provided

## 2020-10-16 NOTE — Assessment & Plan Note (Signed)
Presents for f/u management of R shoulder pain. Mentions significant improvement in sxs after exercises and short course of naproxen. Denies weakness, focal numbness or tingling.   A/P Sxs well controlled - Monitor

## 2020-10-16 NOTE — Patient Instructions (Addendum)
Thank you for allowing [place] to provide your care today. Today we discussed your blood pressure    I have ordered bmp labs for you. I will call if any are abnormal.    Today we made the following changes to your medications.    Please follow-up in 3 months.    Should you have any questions or concerns please call the internal medicine clinic at [phone].     [email or web address]  DASH Eating Plan DASH stands for Dietary Approaches to Stop Hypertension. The DASH eating plan is a healthy eating plan that has been shown to:  Reduce high blood pressure (hypertension).  Reduce your risk for type 2 diabetes, heart disease, and stroke.  Help with weight loss. What are tips for following this plan? Reading food labels  Check food labels for the amount of salt (sodium) per serving. Choose foods with less than 5 percent of the Daily Value of sodium. Generally, foods with less than 300 milligrams (mg) of sodium per serving fit into this eating plan.  To find whole grains, look for the word "whole" as the first word in the ingredient list. Shopping  Buy products labeled as "low-sodium" or "no salt added."  Buy fresh foods. Avoid canned foods and pre-made or frozen meals. Cooking  Avoid adding salt when cooking. Use salt-free seasonings or herbs instead of table salt or sea salt. Check with your health care provider or pharmacist before using salt substitutes.  Do not fry foods. Cook foods using healthy methods such as baking, boiling, grilling, roasting, and broiling instead.  Cook with heart-healthy oils, such as olive, canola, avocado, soybean, or sunflower oil. Meal planning  Eat a balanced diet that includes: ? 4 or more servings of fruits and 4 or more servings of vegetables each day. Try to fill one-half of your plate with fruits and vegetables. ? 6-8 servings of whole grains each day. ? Less than 6 oz (170 g) of lean meat, poultry,  or fish each day. A 3-oz (85-g) serving of meat is about the same size as a deck of cards. One egg equals 1 oz (28 g). ? 2-3 servings of low-fat dairy each day. One serving is 1 cup (237 mL). ? 1 serving of nuts, seeds, or beans 5 times each week. ? 2-3 servings of heart-healthy fats. Healthy fats called omega-3 fatty acids are found in foods such as walnuts, flaxseeds, fortified milks, and eggs. These fats are also found in cold-water fish, such as sardines, salmon, and mackerel.  Limit how much you eat of: ? Canned or prepackaged foods. ? Food that is high in trans fat, such as some fried foods. ? Food that is high in saturated fat, such as fatty meat. ? Desserts and other sweets, sugary drinks, and other foods with added sugar. ? Full-fat dairy products.  Do not salt foods before eating.  Do not eat more than 4 egg yolks a week.  Try to eat at least 2 vegetarian meals a week.  Eat more home-cooked food and less restaurant, buffet, and fast food.   Lifestyle  When eating at a restaurant, ask that your food be prepared with less salt or no salt, if possible.  If you drink alcohol: ? Limit how much you use to:  0-1 drink a day for women who are not pregnant.  0-2 drinks a day for men. ? Be aware of how much alcohol is in your drink. In the [place] one drink equals one 12 oz bottle of  beer (355 mL), one 5 oz glass of wine (148 mL), or one 1 oz glass of hard liquor (44 mL). General information  Avoid eating more than 2,300 mg of salt a day. If you have hypertension, you may need to reduce your sodium intake to 1,500 mg a day.  Work with your health care provider to maintain a healthy body weight or to lose weight. Ask what an ideal weight is for you.  Get at least 30 minutes of exercise that causes your heart to beat faster (aerobic exercise) most days of the week. Activities may include walking, swimming, or biking.  Work with your health care provider or dietitian to adjust your  eating plan to your individual calorie needs. What foods should I eat? Fruits All fresh, dried, or frozen fruit. Canned fruit in natural juice (without added sugar). Vegetables Fresh or frozen vegetables (raw, steamed, roasted, or grilled). Low-sodium or reduced-sodium tomato and vegetable juice. Low-sodium or reduced-sodium tomato sauce and tomato paste. Low-sodium or reduced-sodium canned vegetables. Grains Whole-grain or whole-wheat bread. Whole-grain or whole-wheat pasta. Brown rice. [place]. Bulgur. Whole-grain and low-sodium cereals. Pita bread. Low-fat, low-sodium crackers. Whole-wheat flour tortillas. Meats and other proteins Skinless chicken or [place]. Ground chicken or [place]. Pork with fat trimmed off. Fish and seafood. Egg whites. Dried beans, peas, or lentils. Unsalted nuts, nut butters, and seeds. Unsalted canned beans. Lean cuts of beef with fat trimmed off. Low-sodium, lean precooked or cured meat, such as sausages or meat loaves. Dairy Low-fat (1%) or fat-free (skim) milk. Reduced-fat, low-fat, or fat-free cheeses. Nonfat, low-sodium ricotta or cottage cheese. Low-fat or nonfat yogurt. Low-fat, low-sodium cheese. Fats and oils Soft margarine without trans fats. Vegetable oil. Reduced-fat, low-fat, or light mayonnaise and salad dressings (reduced-sodium). Canola, safflower, olive, avocado, soybean, and sunflower oils. Avocado. Seasonings and condiments Herbs. Spices. Seasoning mixes without salt. Other foods Unsalted popcorn and pretzels. Fat-free sweets. The items listed above may not be a complete list of foods and beverages you can eat. Contact a dietitian for more information. What foods should I avoid? Fruits Canned fruit in a light or heavy syrup. Fried fruit. Fruit in cream or butter sauce. Vegetables Creamed or fried vegetables. Vegetables in a cheese sauce. Regular canned vegetables (not low-sodium or reduced-sodium). Regular canned tomato sauce and paste (not  low-sodium or reduced-sodium). Regular tomato and vegetable juice (not low-sodium or reduced-sodium). Angie Fava. Olives. Grains Baked goods made with fat, such as croissants, muffins, or some breads. Dry pasta or rice meal packs. Meats and other proteins Fatty cuts of meat. Ribs. Fried meat. Berniece Salines. Bologna, salami, and other precooked or cured meats, such as sausages or meat loaves. Fat from the back of a pig (fatback). Bratwurst. Salted nuts and seeds. Canned beans with added salt. Canned or smoked fish. Whole eggs or egg yolks. Chicken or [place] with skin. Dairy Whole or 2% milk, cream, and half-and-half. Whole or full-fat cream cheese. Whole-fat or sweetened yogurt. Full-fat cheese. Nondairy creamers. Whipped toppings. Processed cheese and cheese spreads. Fats and oils Butter. Stick margarine. Lard. Shortening. Ghee. Bacon fat. Tropical oils, such as coconut, palm kernel, or palm oil. Seasonings and condiments Onion salt, garlic salt, seasoned salt, table salt, and sea salt. Worcestershire sauce. Tartar sauce. Barbecue sauce. Teriyaki sauce. Soy sauce, including reduced-sodium. Steak sauce. Canned and packaged gravies. Fish sauce. Oyster sauce. Cocktail sauce. Store-bought horseradish. Ketchup. Mustard. Meat flavorings and tenderizers. Bouillon cubes. Hot sauces. Pre-made or packaged marinades. Pre-made or packaged taco seasonings. Relishes. Regular salad dressings.  Other foods Salted popcorn and pretzels. The items listed above may not be a complete list of foods and beverages you should avoid. Contact a dietitian for more information. Where to find more information  National Heart, Lung, and Blood Institute: https://wilson-eaton.com/  American Heart Association: www.heart.org  Academy of Nutrition and Dietetics: www.eatright.[place]: www.kidney.org Summary  The DASH eating plan is a healthy eating plan that has been shown to reduce high blood pressure (hypertension). It  may also reduce your risk for type 2 diabetes, heart disease, and stroke.  When on the DASH eating plan, aim to eat more fresh fruits and vegetables, whole grains, lean proteins, low-fat dairy, and heart-healthy fats.  With the DASH eating plan, you should limit salt (sodium) intake to 2,300 mg a day. If you have hypertension, you may need to reduce your sodium intake to 1,500 mg a day.  Work with your health care provider or dietitian to adjust your eating plan to your individual calorie needs. This information is not intended to replace advice given to you by your health care provider. Make sure you discuss any questions you have with your health care provider. Document Revised: 05/25/2019 Document Reviewed: 05/25/2019 Elsevier Patient Education  2021 [employer].

## 2020-10-16 NOTE — Progress Notes (Signed)
CC: Shoulder pain  HPI: [name] is a 80 y.o. with PMH listed below presenting with complaint of shoulder pain. Please see problem based assessment and plan for further details.  Past Medical History:  Diagnosis Date  . 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 Chronic cough    with negative chest CT  . Constipation 06/02/2016  . Dental infection 09/29/2017  . Diabetes mellitus    history of last HA1C 05/2013 6.1, over the years been <6.5   . Fall 06/02/2016  . Hyperlipidemia   . Hypertension   . LSIL (low grade squamous intraepithelial lesion) on Pap smear    04/2013   . Neck pain 11/30/2017  . Osteoporosis    Dexa scan 4/09, starting alendronate 6/09; DEXA 01/29/2013 with T score ranges -2.5 to -3.4 rec repeat DEXA in 1 yr  . 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  Eyes: Negative for blurred vision.  Respiratory: Negative for shortness of breath.   Cardiovascular: Negative for chest pain, palpitations and leg swelling.  Gastrointestinal: Negative for constipation, diarrhea, nausea and vomiting.       ''frequent gurgling''  Musculoskeletal: Negative for back pain and joint pain.  Neurological: Negative for dizziness and headaches.  All other systems reviewed and are negative.   Physical Exam: Vitals:   10/16/20 1603  BP: (!) 141/50  Pulse: 77  Temp: 98.3 F (36.8 C)  TempSrc: Oral  SpO2: 100%  Weight: 114 lb 3.2 oz (51.8 kg)  Height: 4\' 9"  (1.448 m)   Gen: Well-developed, well nourished, NAD HEENT: NCAT head, hearing intact CV: RRR, S1, S2 normal Pulm: CTAB, No rales, no wheezes Abd: BS+, NTND Extm: ROM intact, Peripheral pulses intact, No peripheral edema Skin: Dry, Warm, normal turgor, no wounds, no rashes, no lesions  Assessment & Plan:    Essential hypertension BP Readings from Last 3 Encounters:  10/16/20 (!) 141/50  09/08/20 (!) 144/67  09/08/20 (!) 142/60   Above goal. Currently on amlodipine, lisinopril. Lisinopril dose increased to 40mg  on last visit. Denies significant side effects. Denies chest pain, palpitations, blurry vision, headaches.  A/P Presenting with HTN. Above goal but family express concern regarding polypharmacy and increasing anti-hypertensive regimen. Discussed risk and benefit of aggressive bp control. Also discussed dietary choices and family mentions heavy salt intake. Will advise dietary changes. - Information provided on DASH diet - Bmp - C/w amlodipine 5mg  daily, lisinopril 40mg  daily  Impingement syndrome of right shoulder Presents for f/u management of R shoulder pain. Mentions significant improvement in sxs after exercises and short course of naproxen. Denies weakness, focal numbness or tingling.   A/P Sxs well controlled - Monitor  Hyperlipidemia Started on atorvastatin 20mg  daily on last visit due to ASCVD risk of 41%. Denies significant myalgias after starting statin therapy. Previously recommended high intensity statin but family refused due to concern of significant side effects.  A/P Discussed benefit of aggressively treating hyperlipidemia. Discussed increasing dose of statin therapy but family would like to trial lifestyle modifications with current statin dose prior to up-titrating therapy. - Recheck lipid panel at next visit - Information on dietary changes for hyperlipidemia provided    Patient discussed with Dr. Evette Doffing  -Gilberto Better, PGY3  [facility] Pager: [phone]

## 2020-10-17 LAB — BMP8+ANION GAP
Anion Gap: 20 mmol/L — ABNORMAL HIGH (ref 10.0–18.0)
BUN/Creatinine Ratio: 14 (ref 12–28)
BUN: 13 mg/dL (ref 8–27)
CO2: 21 mmol/L (ref 20–29)
Calcium: 10 mg/dL (ref 8.7–10.3)
Chloride: 99 mmol/L (ref 96–106)
Creatinine, Ser: 0.9 mg/dL (ref 0.57–1.00)
Glucose: 125 mg/dL — ABNORMAL HIGH (ref 65–99)
Potassium: 4.9 mmol/L (ref 3.5–5.2)
Sodium: 140 mmol/L (ref 134–144)
eGFR: 65 mL/min/{1.73_m2} (ref >59)

## 2020-10-17 NOTE — Progress Notes (Signed)
Internal Medicine Clinic Attending  Case discussed with Dr. Lee  At the time of the visit.  We reviewed the resident's history and exam and pertinent patient test results.  I agree with the assessment, diagnosis, and plan of care documented in the resident's note.    

## 2020-10-20 ENCOUNTER — Encounter: Payer: Self-pay | Admitting: Internal Medicine

## 2020-10-20 ENCOUNTER — Encounter: Payer: Self-pay | Admitting: Dietician

## 2020-11-12 ENCOUNTER — Other Ambulatory Visit: Payer: Self-pay | Admitting: *Deleted

## 2020-11-12 DIAGNOSIS — I10 Essential (primary) hypertension: Secondary | ICD-10-CM

## 2020-11-12 MED ORDER — LISINOPRIL 40 MG PO TABS: 40.0000 mg | ORAL_TABLET | Freq: Every day | ORAL | 3 refills | Status: DC

## 2020-11-13 ENCOUNTER — Telehealth: Payer: Self-pay

## 2020-11-13 NOTE — Telephone Encounter (Signed)
#  90 with 3 refills sent to requested pharmacy yesterday.

## 2020-11-13 NOTE — Telephone Encounter (Signed)
lisinopril (ZESTRIL) 40 MG tablet, refill request @  [place], [place] Phone:  [phone]  Fax:  [phone]

## 2020-11-26 ENCOUNTER — Encounter: Payer: Self-pay | Admitting: *Deleted

## 2021-03-23 ENCOUNTER — Other Ambulatory Visit: Payer: Self-pay

## 2021-03-23 DIAGNOSIS — E119 Type 2 diabetes mellitus without complications: Secondary | ICD-10-CM

## 2021-03-25 ENCOUNTER — Encounter: Payer: Self-pay | Admitting: Internal Medicine

## 2021-03-25 MED ORDER — METFORMIN HCL 1000 MG PO TABS: 1000.0000 mg | ORAL_TABLET | Freq: Two times a day (BID) | ORAL | 0 refills | Status: DC

## 2021-03-25 NOTE — Telephone Encounter (Signed)
Patient canceled her appointment with me today. I have approved a 30 day refill of her metformin. Please have her reschedule with me. Thanks!

## 2021-04-22 ENCOUNTER — Encounter: Payer: Self-pay | Admitting: Internal Medicine

## 2021-05-01 ENCOUNTER — Encounter: Payer: Self-pay | Admitting: Internal Medicine

## 2021-05-07 ENCOUNTER — Other Ambulatory Visit: Payer: Self-pay | Admitting: Internal Medicine

## 2021-05-07 DIAGNOSIS — E119 Type 2 diabetes mellitus without complications: Secondary | ICD-10-CM

## 2021-05-07 NOTE — Telephone Encounter (Signed)
No appointment made at this time.  Message forwarding to PCP and Engineer, building services, Susette Racer.  Patient has cancelled her last four office visits, the most recent being on 05/01/2021.

## 2021-06-17 ENCOUNTER — Other Ambulatory Visit: Payer: Self-pay | Admitting: Internal Medicine

## 2021-06-17 DIAGNOSIS — E119 Type 2 diabetes mellitus without complications: Secondary | ICD-10-CM

## 2021-07-21 ENCOUNTER — Telehealth: Payer: Self-pay | Admitting: Dietician

## 2021-07-21 NOTE — Telephone Encounter (Signed)
Left voicemail for return call  

## 2021-08-05 ENCOUNTER — Encounter: Payer: Self-pay | Admitting: Internal Medicine

## 2021-08-10 ENCOUNTER — Other Ambulatory Visit: Payer: Self-pay | Admitting: Internal Medicine

## 2021-08-10 DIAGNOSIS — E119 Type 2 diabetes mellitus without complications: Secondary | ICD-10-CM

## 2021-08-11 NOTE — Telephone Encounter (Signed)
Patient has no showed multiple appointments. I gave her a 30 day refill of her metformin on 03/25/21 with the expectation that she be seen. I have refilled twice since then and she continues to cancel or no show her appointments. Refill for metformin declined until she follows up.

## 2021-08-14 ENCOUNTER — Encounter: Payer: Self-pay | Admitting: Student

## 2021-08-14 ENCOUNTER — Other Ambulatory Visit: Payer: Self-pay

## 2021-08-14 ENCOUNTER — Ambulatory Visit: Payer: Self-pay | Admitting: Student

## 2021-08-14 VITALS — BP 155/66 | HR 64 | Temp 98.2°F | Ht 60.0 in | Wt 110.7 lb

## 2021-08-14 DIAGNOSIS — E1151 Type 2 diabetes mellitus with diabetic peripheral angiopathy without gangrene: Secondary | ICD-10-CM

## 2021-08-14 DIAGNOSIS — M7541 Impingement syndrome of right shoulder: Secondary | ICD-10-CM

## 2021-08-14 DIAGNOSIS — E119 Type 2 diabetes mellitus without complications: Secondary | ICD-10-CM

## 2021-08-14 DIAGNOSIS — E785 Hyperlipidemia, unspecified: Secondary | ICD-10-CM

## 2021-08-14 DIAGNOSIS — I10 Essential (primary) hypertension: Secondary | ICD-10-CM

## 2021-08-14 LAB — GLUCOSE, CAPILLARY: Glucose-Capillary: 93 mg/dL (ref 70–99)

## 2021-08-14 LAB — POCT GLYCOSYLATED HEMOGLOBIN (HGB A1C): Hemoglobin A1C: 7 % — AB (ref 4.0–5.6)

## 2021-08-14 MED ORDER — ATORVASTATIN CALCIUM 20 MG PO TABS: 20.0000 mg | ORAL_TABLET | Freq: Every day | ORAL | 3 refills | Status: DC

## 2021-08-14 MED ORDER — METOPROLOL SUCCINATE ER 50 MG PO TB24: 50.0000 mg | ORAL_TABLET | Freq: Every day | ORAL | 3 refills | Status: DC

## 2021-08-14 MED ORDER — HYDROCHLOROTHIAZIDE 25 MG PO TABS: 12.5000 mg | ORAL_TABLET | Freq: Every day | ORAL | 3 refills | Status: DC

## 2021-08-14 MED ORDER — LISINOPRIL 40 MG PO TABS: 40.0000 mg | ORAL_TABLET | Freq: Every day | ORAL | 3 refills | Status: DC

## 2021-08-14 MED ORDER — METFORMIN HCL 1000 MG PO TABS: 1000.0000 mg | ORAL_TABLET | Freq: Two times a day (BID) | ORAL | 3 refills | Status: DC

## 2021-08-14 NOTE — Assessment & Plan Note (Signed)
Last lipid panel in May 2022 showed an LDL of 58.  LDL goal <70.  LDL currently at goal. -- Refilled atorvastatin 20 mg daily

## 2021-08-14 NOTE — Assessment & Plan Note (Addendum)
BP elevated today to systolic in the 470J with improvement to 150s on repeat. Patient's regimen slightly modified by cardiology during hospitalization for syncope last year. Amlodipine was discontinued and metoprolol was started for hyperdynamic EF 75%.  Patient denies any blurry vision, headaches or palpitations. Patient and granddaughter counseled on DASH diet and agreeable to plan to start patient on HCTZ. Vitals:   08/14/21 1045 08/14/21 1152  BP: (!) 178/68 (!) 155/66     Plan: -- Start HCTZ 12.5 mg daily -- Continue lisinopril 40 mg daily -- Decrease metoprolol from 75 to 50 mg daily -- Follow-up BMP -- Check BP few times a week at home and bring readings next office visit  Addendum: BMP showed normal kidney function and no electrolyte abnormalities.

## 2021-08-14 NOTE — Assessment & Plan Note (Addendum)
A1c improved to 7.0%. Patient's granddaughter states she has been cooking relatively healthy meals for patient. Patient denies any polyuria, polydipsia or dizziness. Patient had a few syncope episodes last year that was evaluated by cardiology. She was found to have hyperdynamic EF however no abnormalities on EKG or imaging. Patient has not had any syncope episodes in the past 6 months.  Plan: -- Continue metformin 1000 mg twice daily -- Continue lifestyle modifications with dietary changes -- Follow-up for repeat A1c in 3-6 months

## 2021-08-14 NOTE — Progress Notes (Signed)
° °  CC: Follow-up  HPI:  Connie Powell is a 81 y.o. female with PMH as below who presents to clinic accompanied by her granddaughter for follow-up on her chronic medical problems. Please see problem based charting for evaluation, assessment and plan.  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  f/u with GYN in 02/2007   Chronic cough    with negative chest CT   Constipation 06/02/2016   Dental infection 09/29/2017   Diabetes mellitus    history of last HA1C 05/2013 6.1, over the years been <6.5    Fall 06/02/2016   Hyperlipidemia    Hypertension    LSIL (low grade squamous intraepithelial lesion) on Pap smear    04/2013    Neck pain 11/30/2017   Osteoporosis    Dexa scan 4/09, starting alendronate 6/09; DEXA 01/29/2013 with T score ranges -2.5 to -3.4 rec repeat DEXA in 1 yr   Red eye    chronic red eye, 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 Negative for back pain Neuro: Negative for headache, syncope, dizziness or weakness  Physical Exam: General: Pleasant, Well-appearing [place]-speaking elderly woman. No acute distress. Cardiac: RRR. No murmurs, rubs or gallops. No LE edema Respiratory: Lungs CTAB. No wheezing or crackles. Skin: Warm, dry and intact without rashes or lesions MSK: Right shoulder: No tenderness to palpation. No crepitus or effusion. Normal ROM. Negative Neer test and Hawkins-Kennedy test. Extremities: Atraumatic. Full ROM.  2+ radial and DP pulses.  Neuro: A&O x 3. Moves all extremities. Normal sensation to gross touch. Psych: Appropriate mood and affect.  Vitals:   08/14/21 1045 08/14/21 1152  BP: (!) 178/68 (!) 155/66  Pulse: 70 64  Temp: 98.2 F (36.8 C)   TempSrc: Oral   SpO2: 100%   Weight:  110 lb 11.2 oz (50.2 kg)   Height: 5' (1.524 m)        Assessment & Plan:   See Encounters Tab for problem based charting.  Patient discussed with Dr. Lorenz Coaster, MD, MPH

## 2021-08-14 NOTE — Patient Instructions (Signed)
Thank you, Ms.Amran Palladino for allowing [place] to provide your care today. Today we discussed your blood pressure, shoulder pain and diabetes.  Your diabetes is well controlled.  Take over-the-counter Tylenol alternating with ibuprofen to help with your shoulder pain.  Follow-up if the pain gets worse.  For your blood pressure, we are starting a new medication to check your blood pressure at home few times a week.  Your goal should be less than 140/90.   I have ordered the following labs for you:   Lab Orders         BMP8+Anion Gap         Glucose, capillary         POC Hbg A1C      I will call if any are abnormal. All of your labs can be accessed through "My Chart".   I have ordered the following medication/changed the following medications:  Start HCTZ 12.5 mg daily Decrease metoprolol to 50 mg daily  My Chart Access: [email or web address]  Please follow-up in 3 to 6 months  Please make sure to arrive 15 minutes prior to your next appointment. If you arrive late, you may be asked to reschedule.    We look forward to seeing you next time. Please call our clinic at [phone] if you have any questions or concerns. The best time to call is Monday-Friday from 9am-4pm, but there is someone available 24/7. If after hours or the weekend, call the main hospital number and ask for the Internal Medicine Resident On-Call. If you need medication refills, please notify your pharmacy one week in advance and they will send [place] a request.   Thank you for letting us take part in your care. Wishing you the best!  Lacinda Axon, MD 08/14/2021, 11:49 AM IM Resident, PGY-2 [facility] 41:10

## 2021-08-14 NOTE — Assessment & Plan Note (Addendum)
Patient reports occasional right shoulder pain that started after the removal of the axillary mass many years ago. The pain is intermittent, mild and she will reexperience the pain when she put pressure on her shoulder. She denies any numbness, tingling or arm weakness. No recent trauma to the shoulder. On exam, there is no ttp of the right shoulder, no crepitus or effusion. Neer's and Michel Bickers test negative.  Per granddaughter, patient has been referred to physical therapy in the past but unable to use them due to lack of insurance. We will continue conservative management and closely monitor for worsening symptoms.  Plan: -- OTC ibuprofen, alternating with Tylenol as needed for pain -- Home shoulder exercises -- Advised to follow-up in clinic if shoulder pain becomes more frequent, worsens or patient develops neurological symptoms.

## 2021-08-15 LAB — BMP8+ANION GAP
Anion Gap: 17 mmol/L (ref 10.0–18.0)
BUN/Creatinine Ratio: 19 (ref 12–28)
BUN: 15 mg/dL (ref 8–27)
CO2: 23 mmol/L (ref 20–29)
Calcium: 10 mg/dL (ref 8.7–10.3)
Chloride: 101 mmol/L (ref 96–106)
Creatinine, Ser: 0.79 mg/dL (ref 0.57–1.00)
Glucose: 101 mg/dL — ABNORMAL HIGH (ref 70–99)
Potassium: 4.4 mmol/L (ref 3.5–5.2)
Sodium: 141 mmol/L (ref 134–144)
eGFR: 76 mL/min/{1.73_m2} (ref >59)

## 2021-08-17 NOTE — Progress Notes (Signed)
Internal Medicine Clinic Attending  Case discussed with Dr. Coy Saunas    At the time of the visit.  We reviewed the residents history and exam and pertinent patient test results.  I agree with the assessment, diagnosis, and plan of care documented in the residents note. Note Typo: Should read increase metoprolol to 50mg  daily

## 2021-11-10 ENCOUNTER — Other Ambulatory Visit: Payer: Self-pay | Admitting: Student

## 2021-11-10 DIAGNOSIS — E119 Type 2 diabetes mellitus without complications: Secondary | ICD-10-CM

## 2021-11-11 NOTE — Telephone Encounter (Signed)
Call to [facility].  Did not receive prescription for the Metformin sent in February.  Prescription was given verbally as patient is out of medication.   ?

## 2022-01-12 ENCOUNTER — Other Ambulatory Visit: Payer: Self-pay | Admitting: Student

## 2022-01-12 DIAGNOSIS — I10 Essential (primary) hypertension: Secondary | ICD-10-CM

## 2022-01-12 MED ORDER — HYDROCHLOROTHIAZIDE 25 MG PO TABS: 25.0000 mg | ORAL_TABLET | Freq: Every day | ORAL | 3 refills | Status: DC

## 2022-01-12 MED ORDER — LISINOPRIL 40 MG PO TABS: 40.0000 mg | ORAL_TABLET | Freq: Every day | ORAL | 3 refills | Status: DC

## 2022-02-22 ENCOUNTER — Ambulatory Visit (INDEPENDENT_AMBULATORY_CARE_PROVIDER_SITE_OTHER): Payer: Self-pay | Admitting: Student

## 2022-02-22 ENCOUNTER — Encounter: Payer: Self-pay | Admitting: Student

## 2022-02-22 ENCOUNTER — Other Ambulatory Visit: Payer: Self-pay

## 2022-02-22 ENCOUNTER — Other Ambulatory Visit (HOSPITAL_COMMUNITY): Payer: Self-pay

## 2022-02-22 VITALS — BP 119/75 | HR 58 | Temp 98.2°F | Wt 113.5 lb

## 2022-02-22 DIAGNOSIS — Z7984 Long term (current) use of oral hypoglycemic drugs: Secondary | ICD-10-CM

## 2022-02-22 DIAGNOSIS — M810 Age-related osteoporosis without current pathological fracture: Secondary | ICD-10-CM

## 2022-02-22 DIAGNOSIS — D649 Anemia, unspecified: Secondary | ICD-10-CM

## 2022-02-22 DIAGNOSIS — E119 Type 2 diabetes mellitus without complications: Secondary | ICD-10-CM

## 2022-02-22 DIAGNOSIS — I10 Essential (primary) hypertension: Secondary | ICD-10-CM

## 2022-02-22 LAB — POCT GLYCOSYLATED HEMOGLOBIN (HGB A1C): Hemoglobin A1C: 11.1 % — AB (ref 4.0–5.6)

## 2022-02-22 LAB — GLUCOSE, CAPILLARY: Glucose-Capillary: 430 mg/dL — ABNORMAL HIGH (ref 70–99)

## 2022-02-22 MED ORDER — EMPAGLIFLOZIN 10 MG PO TABS
10.0000 mg | ORAL_TABLET | Freq: Every day | ORAL | 3 refills | Status: DC
  Filled 2022-02-22: qty 30, 30d supply, fill #0

## 2022-02-22 NOTE — Patient Instructions (Signed)
Thank you, Ms.Jeriah Hissong for allowing [place] to provide your care today. Today we discussed your blood pressure, and diabetes. Your blood pressure is very well-controlled. Your diabetes numbers have significantly increase with your A1c up to 11.1%.  I want you to make sure you are taking your metformin twice a day and decreasing the amount of carbs in your diet. I have started you on a new diabetes medication.   I have ordered the following labs for you:   Lab Orders         Glucose, capillary         BMP8+Anion Gap         Vitamin D (25 hydroxy)         POC Hbg A1C      I will call if any are abnormal. All of your labs can be accessed through "My Chart".  I have ordered the following medication/changed the following medications:  Start Jardiance 10 mg daily  My Chart Access: [email or web address]  Please follow-up with PCP in 3 months  Please make sure to arrive 15 minutes prior to your next appointment. If you arrive late, you may be asked to reschedule.    We look forward to seeing you next time. Please call our clinic at [phone] if you have any questions or concerns. The best time to call is Monday-Friday from 9am-4pm, but there is someone available 24/7. If after hours or the weekend, call the main hospital number and ask for the Internal Medicine Resident On-Call. If you need medication refills, please notify your pharmacy one week in advance and they will send [place] a request.   Thank you for letting us take part in your care. Wishing you the best!  Lacinda Axon, MD 02/22/2022, 11:33 AM IM Resident, PGY-3 [facility] 41:10

## 2022-02-22 NOTE — Progress Notes (Unsigned)
CC: Follow-up  HPI:  Ms.Awanda Lichtenwalner is a 81 y.o. female with PMH as below who presents with her granddaughter for follow-up on her chronic medical problems. Please see problem based charting for evaluation, assessment and plan.  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  f/u with GYN in 02/2007   Chronic cough    with negative chest CT   Constipation 06/02/2016   Dental infection 09/29/2017   Diabetes mellitus    history of last HA1C 05/2013 6.1, over the years been <6.5    Fall 06/02/2016   Hyperlipidemia    Hypertension    LSIL (low grade squamous intraepithelial lesion) on Pap smear    04/2013    Neck pain 11/30/2017   Osteoporosis    Dexa scan 4/09, starting alendronate 6/09; DEXA 01/29/2013 with T score ranges -2.5 to -3.4 rec repeat DEXA in 1 yr   Red eye    chronic red eye, 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 Negative for headache, dizziness or weakness  Physical Exam: General: Pleasant, well-appearing elderly [place] woman. No acute distress. Cardiac: RRR. No murmurs, rubs or gallops. No LE edema Respiratory: Lungs CTAB. No wheezing or crackles. Abdominal: Soft, symmetric and non tender. Normal BS. Skin: Warm, dry. Mildly decreased skin turgor. Extremities: Atraumatic. Full ROM. Radial and DP pulses 2+ and symmetric. Neuro: A&O x 3. Moves all extremities.  Normal sensation to gross touch. Psych: Appropriate mood and affect.  Vitals:   02/22/22 1040  BP: 119/75  Pulse: (!) 58  Temp: 98.2 F (36.8 C)  TempSrc: Oral  SpO2: 100%  Weight: 113 lb 8 oz (51.5 kg)     Assessment &  Plan:   Essential hypertension BP is well controlled on current BP regimen with BP today of 119/75.  Patient denies any dizziness, blurry vision or headache. Per granddaughter, patient has been taking taking the full pill of the HCTZ 25 mg instead of the 12.5 mg her blood pressures have been normal with SBP in the 110s to 120s at home. BMP today shows mild AKI with creatinine of 1.14.  Granddaughter reports that patient has not been drinking enough water and barely drinking 2 of the 500 mL water bottles a day. Patient's AKI could likely be due to dehydration in the setting of decreased water intake and possible osmotic diuresis from high glucose load.  Patient advised advised to try to drink 3-4 of the 500 mL water bottles a day.  We will recheck BMP at next office visit and make changes to her BP regimen if AKI is not resolved.   Plan: -Continue HCTZ 25 mg daily -Continue lisinopril 40 mg daily -Continue metoprolol 50 mg daily -Repeat BP and BMP at next office visit  Osteoporosis Last DEXA scan in 2014 was consistent with osteoporosis.Vitamin D level was today in the low normal range at 35.3.  Patient and family declined treatment with Fosamax in the past. Patient was previously on multivitamins and calcium supplements. No history of falls or pathologic fractures. Calcium levels stable at 10.0. Patient encouraged to continue supplements.  Plan: -Continue OTC Calcium 1200  mg qd, vitamin D 1000 IU  Type 2 diabetes mellitus ([place]) Patient here for 6 months diabetes follow-up.  A1c has increased from 7.0% 6 months ago to 11.1% today. Per granddaughter, patient has been eating foods high in carbs which they have been trying to work on. Patient's diet usually includes oatmeal with sugar in the morning, [place] cuisine that is high in starch such as rice or yams in the afternoon as well as cereal late at night. Patient is also taking her metformin 1000 mg only once daily instead of twice daily. She has  been endorsing occasional numbness but denies any polyuria, polydipsia, changes in her vision, dizziness or syncope.  Discussed with patient and her granddaughter the importance of dietary changes and diabetes control.  Discussed decreasing her intake of foods high in starch, eating more vegetables and avoiding eating late at night.  She has no history of DKA.  I advised her to take her metformin twice daily and start Jardiance. We will follow-up with patient in 3 months for repeat A1c. Patient also due for diabetic eye exam but is currently self-pay. Granddaughter states they will look for an eye doctor in [place].  Plan: -Start Jardiance 10 mg daily -Take metformin 1000 mg twice daily -Lifestyle modifications with dietary changes and exercise as instructed -Follow-up in 3 months for A1c check    See Encounters Tab for problem based charting.  Patient discussed with Dr. Michelle Nasuti, MD, MPH

## 2022-02-23 LAB — BMP8+ANION GAP
Anion Gap: 22 mmol/L — ABNORMAL HIGH (ref 10.0–18.0)
BUN/Creatinine Ratio: 19 (ref 12–28)
BUN: 22 mg/dL (ref 8–27)
CO2: 20 mmol/L (ref 20–29)
Calcium: 10 mg/dL (ref 8.7–10.3)
Chloride: 97 mmol/L (ref 96–106)
Creatinine, Ser: 1.14 mg/dL — ABNORMAL HIGH (ref 0.57–1.00)
Glucose: 237 mg/dL — ABNORMAL HIGH (ref 70–99)
Potassium: 4.9 mmol/L (ref 3.5–5.2)
Sodium: 139 mmol/L (ref 134–144)
eGFR: 49 mL/min/{1.73_m2} — ABNORMAL LOW (ref >59)

## 2022-02-23 LAB — VITAMIN D 25 HYDROXY (VIT D DEFICIENCY, FRACTURES): Vit D, 25-Hydroxy: 35.3 ng/mL (ref 30.0–100.0)

## 2022-02-24 ENCOUNTER — Encounter: Payer: Self-pay | Admitting: Student

## 2022-02-24 NOTE — Assessment & Plan Note (Addendum)
Last DEXA scan in 2014 was consistent with osteoporosis.Vitamin D level was today in the low normal range at 35.3.  Patient and family declined treatment with Fosamax in the past. Patient was previously on multivitamins and calcium supplements. No history of falls or pathologic fractures. Calcium levels stable at 10.0. Patient encouraged to continue supplements.  Plan: -Continue OTC Calcium 1200 mg qd, vitamin D 1000 IU

## 2022-02-24 NOTE — Assessment & Plan Note (Signed)
Patient here for 6 months diabetes follow-up.  A1c has increased from 7.0% 6 months ago to 11.1% today. Per granddaughter, patient has been eating foods high in carbs which they have been trying to work on. Patient's diet usually includes oatmeal with sugar in the morning, [place] cuisine that is high in starch such as rice or yams in the afternoon as well as cereal late at night. Patient is also taking her metformin 1000 mg only once daily instead of twice daily. She has been endorsing occasional numbness but denies any polyuria, polydipsia, changes in her vision, dizziness or syncope.  Discussed with patient and her granddaughter the importance of dietary changes and diabetes control.  Discussed decreasing her intake of foods high in starch, eating more vegetables and avoiding eating late at night.  She has no history of DKA.  I advised her to take her metformin twice daily and start Jardiance. We will follow-up with patient in 3 months for repeat A1c. Patient also due for diabetic eye exam but is currently self-pay. Granddaughter states they will look for an eye doctor in [place].  Plan: -Start Jardiance 10 mg daily -Take metformin 1000 mg twice daily -Lifestyle modifications with dietary changes and exercise as instructed -Follow-up in 3 months for A1c check

## 2022-02-24 NOTE — Assessment & Plan Note (Addendum)
BP is well controlled on current BP regimen with BP today of 119/75.  Patient denies any dizziness, blurry vision or headache. Per granddaughter, patient has been taking taking the full pill of the HCTZ 25 mg instead of the 12.5 mg her blood pressures have been normal with SBP in the 110s to 120s at home. BMP today shows mild AKI with creatinine of 1.14.  Granddaughter reports that patient has not been drinking enough water and barely drinking 2 of the 500 mL water bottles a day. Patient's AKI could likely be due to dehydration in the setting of decreased water intake and possible osmotic diuresis from high glucose load.  Patient advised advised to try to drink 3-4 of the 500 mL water bottles a day.  We will recheck BMP at next office visit and make changes to her BP regimen if AKI is not resolved.   Plan: -Continue HCTZ 25 mg daily -Continue lisinopril 40 mg daily -Continue metoprolol 50 mg daily -Repeat BP and BMP at next office visit

## 2022-02-25 NOTE — Progress Notes (Signed)
Internal Medicine Clinic Attending  Case discussed with Dr. A[place]onsah  At the time of the visit.  We reviewed the resident's history and exam and pertinent patient test results.  I agree with the assessment, diagnosis, and plan of care documented in the resident's note.  

## 2022-03-15 ENCOUNTER — Other Ambulatory Visit: Payer: Self-pay

## 2022-03-15 DIAGNOSIS — E119 Type 2 diabetes mellitus without complications: Secondary | ICD-10-CM

## 2022-03-15 MED ORDER — METFORMIN HCL 1000 MG PO TABS: 1000.0000 mg | ORAL_TABLET | Freq: Two times a day (BID) | ORAL | 3 refills | Status: DC

## 2022-03-27 ENCOUNTER — Other Ambulatory Visit: Payer: Self-pay | Admitting: Student

## 2022-03-27 DIAGNOSIS — E119 Type 2 diabetes mellitus without complications: Secondary | ICD-10-CM

## 2022-03-27 DIAGNOSIS — E785 Hyperlipidemia, unspecified: Secondary | ICD-10-CM

## 2022-03-27 MED ORDER — EMPAGLIFLOZIN 10 MG PO TABS: 10.0000 mg | ORAL_TABLET | Freq: Every day | ORAL | 3 refills | Status: AC

## 2022-03-27 MED ORDER — ATORVASTATIN CALCIUM 20 MG PO TABS: 20.0000 mg | ORAL_TABLET | Freq: Every day | ORAL | 3 refills | Status: DC

## 2022-03-27 NOTE — Progress Notes (Signed)
Refilled 90-day supplies of Atorvastatin and Jardiance at [facility] in [place]. Discussed with daughter that patient will need follow for repeat A1c by the end of of the year.

## 2022-03-30 ENCOUNTER — Telehealth: Payer: Self-pay

## 2022-03-30 NOTE — Telephone Encounter (Signed)
Error

## 2022-08-11 ENCOUNTER — Other Ambulatory Visit: Payer: Self-pay | Admitting: Student

## 2022-08-11 DIAGNOSIS — I10 Essential (primary) hypertension: Secondary | ICD-10-CM

## 2022-09-14 NOTE — Progress Notes (Signed)
   CC: Medication Refills  HPI:   [name] is a 82 y.o. female with a past medical history of hypertension, hyperlipidemia, diabetes, and osteoporosis who presents for overdue follow-up. She was last seen at [facility] in 02-2022, planning a move to [place] next month and is requesting medication refills before relocating.   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  f/u with GYN in 02/2007   Chronic cough    with negative chest CT   Constipation 06/02/2016   Dental infection 09/29/2017   Diabetes mellitus    history of last HA1C 05/2013 6.1, over the years been <6.5    Fall 06/02/2016   Hyperlipidemia    Hypertension    LSIL (low grade squamous intraepithelial lesion) on Pap smear    04/2013    Neck pain 11/30/2017   Osteoporosis    Dexa scan 4/09, starting alendronate 6/09; DEXA 01/29/2013 with T score ranges -2.5 to -3.4 rec repeat DEXA in 1 yr   Red eye    chronic red eye, 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     Review of Systems:    Reports lightheadedness, headache Denies fever, chills, recent illnesses, blurry vision, abdominal or chest pain   Physical Exam:  Vitals:   09/17/22 0922  BP: (!) 126/53  Pulse: 61  Resp: (!) 24  Temp: 98.2 F (36.8 C)  TempSrc: Oral  SpO2: 100%  Weight: 112 lb 4.8 oz (50.9 kg)  Height: 5\' 1"  (1.549 m)    General:   awake and alert, sitting comfortably in chair, cooperative, not in acute distress Eyes:   extraocular movements intact, pupils round  Lungs:   normal respiratory effort, breathing unlabored, symmetrical chest rise, no crackles or wheezing Cardiac:   regular rate and rhythm, normal S1 and S2 Neurologic:   oriented to person-place-time, moving all extremities, no gross focal deficits Psychiatric:   euthymic mood with congruent affect, intelligible speech    Assessment & Plan:   Essential hypertension Patient has history of  hypertension managed with lisinopril, hydrochlorothiazide, and metoprolol which she takes daily. Home BP values consistently 120s/70s and today in clinic 126/53. Denies blurry vision and any recent falls. Reports an intermittent headache that responds well to acetaminophen and lightheadedness associated with abrupt postural changes. Granddaughter explains that patient only drinks a few cups of water per day.  - Continue hydrochlorothiazide 25mg  q24, lisinopril 40mg  q24, and metoprolol 50mg  q24 - Encourage frequent hydration  - Check BMP     Type 2 diabetes mellitus ([place]) Patient has history of diabetes managed with metformin. Prescribed empagliflozin at last visit in 02-2022, but she self-discontinued due to its high cost. Prior A1C collected 02-2022 was 11.1 and today 7.7. Counseled patient on importance of establishing primary care provider in [place] for improved glycemic control.  - Continue metformin 1000mg  q12 - Check uAlb:Cr    Hyperlipidemia Patient has history of hyperlipidemia managed with atorvastatin. Most recent lipid panel collected 11-2020 demonstrated LDL 58 at goal <90.  - Continue atorvastatin 20mg  q24        See Encounters Tab for problem based charting.  Patient discussed with Dr. Daryll Drown

## 2022-09-17 ENCOUNTER — Encounter: Payer: Self-pay | Admitting: Student

## 2022-09-17 ENCOUNTER — Other Ambulatory Visit: Payer: Self-pay

## 2022-09-17 ENCOUNTER — Ambulatory Visit (INDEPENDENT_AMBULATORY_CARE_PROVIDER_SITE_OTHER): Payer: Self-pay | Admitting: Student

## 2022-09-17 VITALS — BP 126/53 | HR 61 | Temp 98.2°F | Resp 24 | Ht 61.0 in | Wt 112.3 lb

## 2022-09-17 DIAGNOSIS — I10 Essential (primary) hypertension: Secondary | ICD-10-CM

## 2022-09-17 DIAGNOSIS — E119 Type 2 diabetes mellitus without complications: Secondary | ICD-10-CM

## 2022-09-17 DIAGNOSIS — E785 Hyperlipidemia, unspecified: Secondary | ICD-10-CM

## 2022-09-17 DIAGNOSIS — Z7984 Long term (current) use of oral hypoglycemic drugs: Secondary | ICD-10-CM

## 2022-09-17 LAB — GLUCOSE, CAPILLARY: Glucose-Capillary: 105 mg/dL — ABNORMAL HIGH (ref 70–99)

## 2022-09-17 LAB — POCT GLYCOSYLATED HEMOGLOBIN (HGB A1C): Hemoglobin A1C: 7.7 % — AB (ref 4.0–5.6)

## 2022-09-17 MED ORDER — METOPROLOL SUCCINATE ER 50 MG PO TB24: 50.0000 mg | ORAL_TABLET | Freq: Every day | ORAL | 3 refills | Status: AC

## 2022-09-17 MED ORDER — METFORMIN HCL 1000 MG PO TABS: 1000.0000 mg | ORAL_TABLET | Freq: Two times a day (BID) | ORAL | 6 refills | Status: AC

## 2022-09-17 MED ORDER — HYDROCHLOROTHIAZIDE 25 MG PO TABS: 25.0000 mg | ORAL_TABLET | Freq: Every day | ORAL | 3 refills | Status: AC

## 2022-09-17 MED ORDER — ATORVASTATIN CALCIUM 20 MG PO TABS: 20.0000 mg | ORAL_TABLET | Freq: Every day | ORAL | 3 refills | Status: AC

## 2022-09-17 MED ORDER — LISINOPRIL 40 MG PO TABS: 40.0000 mg | ORAL_TABLET | Freq: Every day | ORAL | 3 refills | Status: AC

## 2022-09-17 NOTE — Assessment & Plan Note (Signed)
Patient has history of hypertension managed with lisinopril, hydrochlorothiazide, and metoprolol which she takes daily. Home BP values consistently 120s/70s and today in clinic 126/53. Denies blurry vision and any recent falls. Reports an intermittent headache that responds well to acetaminophen and lightheadedness associated with abrupt postural changes. Granddaughter explains that patient only drinks a few cups of water per day.  - Continue hydrochlorothiazide 25mg  q24, lisinopril 40mg  q24, and metoprolol 50mg  q24 - Encourage frequent hydration  - Check BMP

## 2022-09-17 NOTE — Assessment & Plan Note (Signed)
Patient has history of hyperlipidemia managed with atorvastatin. Most recent lipid panel collected 11-2020 demonstrated LDL 58 at goal <90.  - Continue atorvastatin 20mg  q24

## 2022-09-17 NOTE — Patient Instructions (Signed)
  Thank you, Ms.Leaira Mccants, for allowing [place] to provide your care today. Today we discussed . . .  > Hypertension       - continue to take your blood pressure medications        - we are sending you a one-year supply including refills       - we are checking some labs and will call you with the results       - please ensure that you are drinking plenty of water throughout the day  > Diabetes       - continue to take your diabetes medication       - we are sending you close to a year supply including refills       - we are checking some labs and will call you with the results    I have ordered the following labs for you:  Lab Orders         Glucose, capillary         BMP8+Anion Gap         Microalbumin / Creatinine Urine Ratio         POC Hbg A1C       Tests ordered today:  none   Referrals ordered today:   Referral Orders  No referral(s) requested today      I have ordered the following medication/changed the following medications:   Stop the following medications: Medications Discontinued During This Encounter  Medication Reason   lisinopril (ZESTRIL) 40 MG tablet Reorder   hydrochlorothiazide (HYDRODIURIL) 25 MG tablet Reorder   metFORMIN (GLUCOPHAGE) 1000 MG tablet Reorder   atorvastatin (LIPITOR) 20 MG tablet Reorder   metoprolol succinate (TOPROL-XL) 50 MG 24 hr tablet Reorder     Start the following medications: Meds ordered this encounter  Medications   atorvastatin (LIPITOR) 20 MG tablet    Sig: Take 1 tablet (20 mg total) by mouth daily.    Dispense:  90 tablet    Refill:  3   hydrochlorothiazide (HYDRODIURIL) 25 MG tablet    Sig: Take 1 tablet (25 mg total) by mouth daily.    Dispense:  90 tablet    Refill:  3   lisinopril (ZESTRIL) 40 MG tablet    Sig: Take 1 tablet (40 mg total) by mouth daily.    Dispense:  90 tablet    Refill:  3   metFORMIN (GLUCOPHAGE) 1000 MG tablet    Sig: Take 1 tablet (1,000 mg total) by mouth 2 (two) times daily  with a meal.    Dispense:  90 tablet    Refill:  6   metoprolol succinate (TOPROL-XL) 50 MG 24 hr tablet    Sig: Take 1 tablet (50 mg total) by mouth daily.    Dispense:  90 tablet    Refill:  3      Follow up:  As needed     Remember:  Please continue to take your medications and establish care with a new doctor in [place] once you arrive. If you return to the [place], then call us to schedule a follow-up appointment.   Should you have any questions or concerns please call the internal medicine clinic at [phone].     Roswell Nickel, MD [place]

## 2022-09-17 NOTE — Assessment & Plan Note (Signed)
Patient has history of diabetes managed with metformin. Prescribed empagliflozin at last visit in 02-2022, but she self-discontinued due to its high cost. Prior A1C collected 02-2022 was 11.1 and today 7.7. Counseled patient on importance of establishing primary care provider in [place] for improved glycemic control.  - Continue metformin 1000mg  q12 - Check uAlb:Cr

## 2022-09-18 LAB — BMP8+ANION GAP
Anion Gap: 14 mmol/L (ref 10.0–18.0)
BUN/Creatinine Ratio: 16 (ref 12–28)
BUN: 17 mg/dL (ref 8–27)
CO2: 25 mmol/L (ref 20–29)
Calcium: 9.8 mg/dL (ref 8.7–10.3)
Chloride: 103 mmol/L (ref 96–106)
Creatinine, Ser: 1.07 mg/dL — ABNORMAL HIGH (ref 0.57–1.00)
Glucose: 114 mg/dL — ABNORMAL HIGH (ref 70–99)
Potassium: 4.8 mmol/L (ref 3.5–5.2)
Sodium: 142 mmol/L (ref 134–144)
eGFR: 52 mL/min/{1.73_m2} — ABNORMAL LOW (ref >59)

## 2022-09-19 LAB — MICROALBUMIN / CREATININE URINE RATIO
Creatinine, Urine: 220 mg/dL
Microalb/Creat Ratio: 31 mg/g creat — ABNORMAL HIGH (ref 0–29)
Microalbumin, Urine: 67.3 ug/mL

## 2022-09-22 NOTE — Progress Notes (Signed)
[facility] Attending  Case discussed with Dr. Harper  at the time of the visit.  We reviewed the resident's history and exam and pertinent patient test results.  I agree with the assessment, diagnosis, and plan of care documented in the resident's note.  

## 2022-09-29 ENCOUNTER — Encounter: Payer: Self-pay | Admitting: Internal Medicine

## 2023-07-04 ENCOUNTER — Other Ambulatory Visit: Payer: Self-pay

## 2023-07-04 DIAGNOSIS — E119 Type 2 diabetes mellitus without complications: Secondary | ICD-10-CM

## 2023-07-04 NOTE — Telephone Encounter (Signed)
Rx refill request from the pharmacy, patient last seen 09/17/22, I called the patient to schedule a fu appointment with [place] unable to reach the patient or lvm.

## 2023-09-22 ENCOUNTER — Other Ambulatory Visit: Payer: Self-pay

## 2023-09-22 DIAGNOSIS — I10 Essential (primary) hypertension: Secondary | ICD-10-CM

## 2023-09-22 NOTE — Telephone Encounter (Signed)
 Patient last seen 09/17/22 I called the patient to schedule a follow up appointment in order for [place] to refill her medications.I was unable to reach her and I was unable to lvm.

## 2023-09-22 NOTE — Telephone Encounter (Signed)
 Refill declined until she schedules. Thanks.

## 2024-07-19 ENCOUNTER — Telehealth: Payer: Self-pay

## 2024-07-19 NOTE — Telephone Encounter (Signed)
 I called the patient and spoke to her grand daughter Ricka. Ricka stated the patient is out of the country and does not know when she will be returning.
